# Patient Record
Sex: Female | Born: 1990 | Hispanic: Yes | Marital: Single | Smoking: Never smoker
Health system: Southern US, Community
[De-identification: ages and names within clinical notes are randomized; demographics above are authoritative.]

## PROBLEM LIST (undated history)

## (undated) DIAGNOSIS — J45909 Unspecified asthma, uncomplicated: Secondary | ICD-10-CM

## (undated) HISTORY — PX: OTHER SURGICAL HISTORY: SHX169

---

## 2014-10-17 ENCOUNTER — Encounter (HOSPITAL_COMMUNITY): Payer: Self-pay | Admitting: Emergency Medicine

## 2014-10-17 ENCOUNTER — Emergency Department (HOSPITAL_COMMUNITY)
Admission: EM | Admit: 2014-10-17 | Discharge: 2014-10-17 | Disposition: A | Discharge status: Home / Self Care | Payer: PRIVATE HEALTH INSURANCE | Attending: Emergency Medicine | Admitting: Emergency Medicine

## 2014-10-17 DIAGNOSIS — R1011 Right upper quadrant pain: Secondary | ICD-10-CM | POA: Diagnosis not present

## 2014-10-17 DIAGNOSIS — R110 Nausea: Secondary | ICD-10-CM | POA: Insufficient documentation

## 2014-10-17 HISTORY — DX: Unspecified asthma, uncomplicated: J45.909

## 2014-10-17 LAB — COMPREHENSIVE METABOLIC PANEL
ALT: 32 U/L (ref 0–53)
ANION GAP: 9 (ref 5–15)
AST: 26 U/L (ref 0–37)
Albumin: 4.6 g/dL (ref 3.5–5.2)
Alkaline Phosphatase: 61 U/L (ref 39–117)
BUN: 8 mg/dL (ref 6–23)
CALCIUM: 9.2 mg/dL (ref 8.4–10.5)
CO2: 22 mmol/L (ref 19–32)
Chloride: 108 mmol/L (ref 96–112)
Creatinine, Ser: 0.69 mg/dL (ref 0.50–1.35)
GFR calc Af Amer: >90 mL/min (ref >90)
GFR calc non Af Amer: >90 mL/min (ref >90)
GLUCOSE: 99 mg/dL (ref 70–99)
Potassium: 3.9 mmol/L (ref 3.5–5.1)
Sodium: 139 mmol/L (ref 135–145)
Total Bilirubin: 1.6 mg/dL — ABNORMAL HIGH (ref 0.3–1.2)
Total Protein: 7.8 g/dL (ref 6.0–8.3)

## 2014-10-17 LAB — CBC WITH DIFFERENTIAL/PLATELET
Basophils Absolute: 0 10*3/uL (ref 0.0–0.1)
Basophils Relative: 1 % (ref 0–1)
Eosinophils Absolute: 0.1 10*3/uL (ref 0.0–0.7)
Eosinophils Relative: 2 % (ref 0–5)
HEMATOCRIT: 47.1 % (ref 39.0–52.0)
Hemoglobin: 16.4 g/dL (ref 13.0–17.0)
LYMPHS ABS: 1.6 10*3/uL (ref 0.7–4.0)
LYMPHS PCT: 32 % (ref 12–46)
MCH: 30.9 pg (ref 26.0–34.0)
MCHC: 34.8 g/dL (ref 30.0–36.0)
MCV: 88.7 fL (ref 78.0–100.0)
Monocytes Absolute: 0.4 10*3/uL (ref 0.1–1.0)
Monocytes Relative: 8 % (ref 3–12)
NEUTROS ABS: 2.9 10*3/uL (ref 1.7–7.7)
Neutrophils Relative %: 57 % (ref 43–77)
Platelets: 234 10*3/uL (ref 150–400)
RBC: 5.31 MIL/uL (ref 4.22–5.81)
RDW: 12.5 % (ref 11.5–15.5)
WBC: 5 10*3/uL (ref 4.0–10.5)

## 2014-10-17 LAB — URINALYSIS, ROUTINE W REFLEX MICROSCOPIC
BILIRUBIN URINE: NEGATIVE
Glucose, UA: NEGATIVE mg/dL
Hgb urine dipstick: NEGATIVE
KETONES UR: NEGATIVE mg/dL
Leukocytes, UA: NEGATIVE
NITRITE: NEGATIVE
PH: 5.5 (ref 5.0–8.0)
PROTEIN: NEGATIVE mg/dL
Specific Gravity, Urine: 1.021 (ref 1.005–1.030)
UROBILINOGEN UA: 1 mg/dL (ref 0.0–1.0)

## 2014-10-17 LAB — LIPASE, BLOOD: Lipase: 20 U/L (ref 11–59)

## 2014-10-17 NOTE — Discharge Instructions (Signed)
Abdominal Pain Your lab work was normal with the exception of bilirubin being slightly high at 1.6. (1.2 is considered normal.) Call the [facility] to get a primary care physician and arrange to get your bilirubin rechecked within a month. Return if you feel worse for any reason Many things can cause belly (abdominal) pain. Most times, the belly pain is not dangerous. Many cases of belly pain can be watched and treated at home. HOME CARE   Do not take medicines that help you go poop (laxatives) unless told to by your doctor.  Only take medicine as told by your doctor.  Eat or drink as told by your doctor. Your doctor will tell you if you should be on a special diet. GET HELP IF:  You do not know what is causing your belly pain.  You have belly pain while you are sick to your stomach (nauseous) or have runny poop (diarrhea).  You have pain while you pee or poop.  Your belly pain wakes you up at night.  You have belly pain that gets worse or better when you eat.  You have belly pain that gets worse when you eat fatty foods.  You have a fever. GET HELP RIGHT AWAY IF:   The pain does not go away within 2 hours.  You keep throwing up (vomiting).  The pain changes and is only in the right or left part of the belly.  You have bloody or tarry looking poop. MAKE SURE YOU:   Understand these instructions.  Will watch your condition.  Will get help right away if you are not doing well or get worse. Document Released: 01/27/2008 Document Revised: 08/15/2013 Document Reviewed: 04/19/2013 [facility] Patient Information 2015 [place], [place]. This information is not intended to replace advice given to you by your health care provider. Make sure you discuss any questions you have with your health care provider.

## 2014-10-17 NOTE — ED Provider Notes (Signed)
CSN: [id number]     Arrival date & time 10/17/14  0847 History   First MD Initiated Contact with Patient 10/17/14 [phone]     No chief complaint on file.  Chief complaint abdominal pain  (Consider location/radiation/quality/duration/timing/severity/associated sxs/prior Treatment) HPI Complains of abdominal pain at right upper quadrant onset yesterday afternoon, nonradiating, waxes and wanes presently a 1 on a scale of 1-10. No treatment prior to coming here he admits to nausea yesterday, none today. Last bowel movement yesterday, normal. No flank pain. No fever. No urinary symptoms. Nothing makes symptoms better or worse. No other associated symptoms. Treated with Pepto-Bismol last night. No past medical history on file. past history negative  No past surgical history on file.  Past surgical history negative  No family history on file. History  Substance Use Topics  . Smoking status: Not on file  . Smokeless tobacco: Not on file  . Alcohol Use: Not on file   No tobacco no alcohol no drugs   Review of Systems  Constitutional: Negative.   HENT: Negative.   Respiratory: Negative.   Cardiovascular: Negative.   Gastrointestinal: Positive for nausea and abdominal pain.  Musculoskeletal: Negative.   Skin: Negative.   Neurological: Negative.   Psychiatric/Behavioral: Negative.   All other systems reviewed and are negative.     Allergies  Review of patient's allergies indicates not on file.  Home Medications   Prior to Admission medications   Not on File   BP 121/73 mmHg  Pulse 64  Temp(Src) 98.1 F (36.7 C) (Oral)  Resp 20  SpO2 99% Physical Exam  Constitutional: He appears well-developed and well-nourished.  HENT:  Head: Normocephalic and atraumatic.  Eyes: Conjunctivae are normal. Pupils are equal, round, and reactive to light.  Neck: Neck supple. No tracheal deviation present. No thyromegaly present.  Cardiovascular: Normal rate and regular rhythm.   No murmur  heard. Pulmonary/Chest: Effort normal and breath sounds normal.  Abdominal: Soft. Bowel sounds are normal. He exhibits no distension. There is no tenderness.  Genitourinary:  testes in norma lie, scrotum normal. No flank tenderness  Musculoskeletal: Normal range of motion. He exhibits no edema or tenderness.  Neurological: He is alert. Coordination normal.  Skin: Skin is warm and dry. No rash noted.  Psychiatric: He has a normal mood and affect.  Nursing note and vitals reviewed.   ED Course  Procedures (including critical care time) Labs Review Labs Reviewed - No data to display  Imaging Review No results found.   EKG Interpretation None     12:20 PM Patient resting comfortably, asymptomatic Results for orders placed or performed during the hospital encounter of 10/17/14  Comprehensive metabolic panel  Result Value Ref Range   Sodium 139 135 - 145 mmol/L   Potassium 3.9 3.5 - 5.1 mmol/L   Chloride 108 96 - 112 mmol/L   CO2 22 19 - 32 mmol/L   Glucose, Bld 99 70 - 99 mg/dL   BUN 8 6 - 23 mg/dL   Creatinine, Ser 0.69 0.50 - 1.35 mg/dL   Calcium 9.2 8.4 - 10.5 mg/dL   Total Protein 7.8 6.0 - 8.3 g/dL   Albumin 4.6 3.5 - 5.2 g/dL   AST 26 0 - 37 U/L   ALT 32 0 - 53 U/L   Alkaline Phosphatase 61 39 - 117 U/L   Total Bilirubin 1.6 (H) 0.3 - 1.2 mg/dL   GFR calc non Af Amer >90 >90 mL/min   GFR calc Af Amer >90 >90 mL/min  Anion gap 9 5 - 15  CBC with Differential/Platelet  Result Value Ref Range   WBC 5.0 4.0 - 10.5 K/uL   RBC 5.31 4.22 - 5.81 MIL/uL   Hemoglobin 16.4 13.0 - 17.0 g/dL   HCT 47.1 39.0 - 52.0 %   MCV 88.7 78.0 - 100.0 fL   MCH 30.9 26.0 - 34.0 pg   MCHC 34.8 30.0 - 36.0 g/dL   RDW 12.5 11.5 - 15.5 %   Platelets 234 150 - 400 K/uL   Neutrophils Relative % 57 43 - 77 %   Neutro Abs 2.9 1.7 - 7.7 K/uL   Lymphocytes Relative 32 12 - 46 %   Lymphs Abs 1.6 0.7 - 4.0 K/uL   Monocytes Relative 8 3 - 12 %   Monocytes Absolute 0.4 0.1 - 1.0 K/uL    Eosinophils Relative 2 0 - 5 %   Eosinophils Absolute 0.1 0.0 - 0.7 K/uL   Basophils Relative 1 0 - 1 %   Basophils Absolute 0.0 0.0 - 0.1 K/uL  Lipase, blood  Result Value Ref Range   Lipase 20 11 - 59 U/L  Urinalysis, Routine w reflex microscopic  Result Value Ref Range   Color, Urine YELLOW YELLOW   APPearance CLEAR CLEAR   Specific Gravity, Urine 1.021 1.005 - 1.030   pH 5.5 5.0 - 8.0   Glucose, UA NEGATIVE NEGATIVE mg/dL   Hgb urine dipstick NEGATIVE NEGATIVE   Bilirubin Urine NEGATIVE NEGATIVE   Ketones, ur NEGATIVE NEGATIVE mg/dL   Protein, ur NEGATIVE NEGATIVE mg/dL   Urobilinogen, UA 1.0 0.0 - 1.0 mg/dL   Nitrite NEGATIVE NEGATIVE   Leukocytes, UA NEGATIVE NEGATIVE   No results found.  MDM  Lab work remarkable for mild hyperbilirubinemia which I don't feel is of clinical consequence no elevated LFTs, normal lipase. Pain is felt to be nonspecific Final diagnoses:  None   plan referral [facility] Diagnosis #1 abdominal pain #2 hyperbilirubinemia      Orlie Dakin, MD 10/17/14 1225

## 2014-10-17 NOTE — ED Notes (Signed)
Pt c/o RLQ pain that started 3 days ago but got worse yesterday.  Pt had some nausea yesterday but denies any today as well as no v/d.  Pt denies any dysuria or hematuria.

## 2014-10-17 NOTE — ED Notes (Signed)
MD at bedside. 

## 2017-10-25 ENCOUNTER — Encounter: Payer: Self-pay | Admitting: Family Medicine

## 2017-11-04 ENCOUNTER — Telehealth: Payer: Self-pay | Admitting: Family Medicine

## 2017-11-04 ENCOUNTER — Other Ambulatory Visit: Payer: Self-pay

## 2017-11-04 ENCOUNTER — Encounter: Payer: Self-pay | Admitting: Family Medicine

## 2017-11-04 ENCOUNTER — Ambulatory Visit (INDEPENDENT_AMBULATORY_CARE_PROVIDER_SITE_OTHER): Payer: Managed Care, Other (non HMO) | Admitting: Family Medicine

## 2017-11-04 VITALS — BP 90/60 | HR 80 | Temp 97.0°F | Resp 16 | Ht 66.54 in | Wt 192.8 lb

## 2017-11-04 DIAGNOSIS — Z789 Other specified health status: Secondary | ICD-10-CM

## 2017-11-04 DIAGNOSIS — F640 Transsexualism: Secondary | ICD-10-CM

## 2017-11-04 DIAGNOSIS — R635 Abnormal weight gain: Secondary | ICD-10-CM | POA: Diagnosis not present

## 2017-11-04 DIAGNOSIS — E049 Nontoxic goiter, unspecified: Secondary | ICD-10-CM

## 2017-11-04 DIAGNOSIS — E349 Endocrine disorder, unspecified: Secondary | ICD-10-CM

## 2017-11-04 MED ORDER — ESTRADIOL 2 MG PO TABS: 6.0000 mg | ORAL_TABLET | Freq: Every day | ORAL | 0 refills | Status: DC

## 2017-11-04 MED ORDER — SPIRONOLACTONE 100 MG PO TABS: 100.0000 mg | ORAL_TABLET | Freq: Two times a day (BID) | ORAL | 0 refills | Status: DC

## 2017-11-04 MED ORDER — DUTASTERIDE 0.5 MG PO CAPS: 0.5000 mg | ORAL_CAPSULE | Freq: Every day | ORAL | 0 refills | Status: DC

## 2017-11-04 NOTE — Patient Instructions (Signed)
     IF you received an x-ray today, you will receive an invoice from [facility]. Please contact [facility] at [phone] with questions or concerns regarding your invoice.   IF you received labwork today, you will receive an invoice from [facility]. Please contact [facility] at [phone] with questions or concerns regarding your invoice.   Our billing staff will not be able to assist you with questions regarding bills from these companies.  You will be contacted with the lab results as soon as they are available. The fastest way to get your results is to activate your My Chart account. Instructions are located on the last page of this paperwork. If you have not heard from us regarding the results in 2 weeks, please contact this office.     

## 2017-11-04 NOTE — Telephone Encounter (Signed)
Copied from [place] [phone]. Topic: Quick Communication - Rx Refill/Question >> Nov 04, 2017  6:36 PM Cecelia Byars, NT wrote: Medication:  medroxyPROGESTERone (PROVERA) 5 MG tablet  Has the patient contacted their pharmacy? {yes  (Agent: If no, request that the patient contact the pharmacy for the refill. Preferred Pharmacy (with phone number or street name [facility] [place], [place] - [place] [phone] (Phone) [phone] (Fax   Agent: Please be advised that RX refills may take up to 3 business days. We ask that you follow-up with your pharmacy.

## 2017-11-04 NOTE — Progress Notes (Signed)
Subjective:  By signing my name below, I, [name], attest that this documentation has been prepared under the direction and in the presence of Delman Cheadle, MD Electronically Signed: Ladene Artist, ED Scribe 11/04/2017 at 5:31 PM.   Patient ID: Elizabeth Lane, female    DOB: [birth year], 27 y.o.   MRN: [id number]  Chief Complaint  Patient presents with  . Establish Care   HPI Elizabeth Lane is a 27 y.o. female who presents to Primary Care at [facility] to establish care. Pt has been on hormone therapy for 2.5 yrs. Denies lightheadedness, dizziness, any other side-effects other than feet swelling when she ran out of the medication but states symptoms resolved after restarting. Estradiol was increased from 2 to 3 tabs daily ~1 yr ago, added provera this yr and pt is doing spironolactone once a day. Pt is interested in switching to injections as she is hoping for more changes body wise; increasing breast size and lowering libido. Nonsmoker. Pt has been walking for exercise and has a pretty decent diet overall.  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  Family Hx Mother has a h/o ovarian CA. No family hx breast CA, early heart disease or stroke.  No past medical history on file.  Current Outpatient Medications on File Prior to Visit  Medication Sig Dispense Refill  . estradiol (ESTRACE) 2 MG tablet     . medroxyPROGESTERone (PROVERA) 5 MG tablet     . spironolactone (ALDACTONE) 100 MG tablet      No current facility-administered medications on file prior to visit.    Allergies  Allergen Reactions  . Lactose Intolerance (Gi)     GI issues   History reviewed. No pertinent surgical history. Family History  Problem Relation Age of Onset  . Cancer Mother    Social History   Socioeconomic History  . Marital status: Single    Spouse name: None  . Number of children: None  . Years of education: None  . Highest education level: None  Social Needs  . Financial  resource strain: None  . Food insecurity - worry: None  . Food insecurity - inability: None  . Transportation needs - medical: None  . Transportation needs - non-medical: None  Occupational History  . None  Tobacco Use  . Smoking status: Never Smoker  . Smokeless tobacco: Never Used  Substance and Sexual Activity  . Alcohol use: Yes    Frequency: Never    Comment: 1 a month/ socailly  . Drug use: No  . Sexual activity: Yes  Other Topics Concern  . None  Social History Narrative  . None   Depression screen [facility] [place] 2/9 11/04/2017  Decreased Interest 0  Down, Depressed, Hopeless 0  PHQ - 2 Score 0     Review of Systems  Neurological: Negative for dizziness and light-headedness.      Objective:   Physical Exam  Constitutional: She is oriented to person, place, and time. She appears well-developed and well-nourished. No distress.  HENT:  Head: Normocephalic and atraumatic.  Eyes: Conjunctivae and EOM are normal.  Neck: Neck supple. No tracheal deviation present.  Cardiovascular: Normal rate, regular rhythm and normal heart sounds.  Pulmonary/Chest: Effort normal and breath sounds normal. No respiratory distress.  Musculoskeletal: Normal range of motion.  Neurological: She is alert and oriented to person, place, and time.  Skin: Skin is warm and dry.  Psychiatric: She has a normal mood and affect. Her behavior is  normal.  Nursing note and vitals reviewed.  BP 90/60 (BP Location: Left Arm, Patient Position: Sitting, Cuff Size: Normal)   Pulse 80   Temp (!) 97 F (36.1 C) (Oral)   Resp 16   Ht 5' 6.54" (1.69 m)   Wt 192 lb 12.8 oz (87.5 kg)   SpO2 97%   BMI 30.62 kg/m     Assessment & Plan:   1. Endocrine disorder - hoping for more feminizing effects so will increase estradiol from 6mg  qd to 8mg  qd as long as estradiol blood level today is not towards high end of normal range.  Try avodart as will block the masculinizing effects of the testosterone though may not  actually lower the blood levels - more feminizing effects than finasteride.  If pt does not feel better on increased dose of estradiol and with avodart, ok to call and will change to weekly injectable estrogen - likely need to start off on 40mg  qwk IM.  2. Weight gain, abnormal - has been working on diet and exercise w/o luck so check tsh, a1c.  Could be progesterone so consider trial off in future - ok to stop in several weeks if she would like to do sev mos trial off to see if that helps w/ weight loss  3. Enlarged thyroid gland - recheck at f/u and consider [place]. Check labs.    Orders Placed This Encounter  Procedures  . TestT+TestF+SHBG  . TSH  . CBC with Differential/Platelet  . Comprehensive metabolic panel  . Estradiol  . FSH/LH  . Hemoglobin A1c    Meds ordered this encounter  Medications  . estradiol (ESTRACE) 2 MG tablet    Sig: Take 3 tablets (6 mg total) by mouth daily.    Dispense:  270 tablet    Refill:  0  . spironolactone (ALDACTONE) 100 MG tablet    Sig: Take 1 tablet (100 mg total) by mouth 2 (two) times daily.    Dispense:  180 tablet    Refill:  0  . dutasteride (AVODART) 0.5 MG capsule    Sig: Take 1 capsule (0.5 mg total) by mouth daily.    Dispense:  90 capsule    Refill:  0  . medroxyPROGESTERone (PROVERA) 5 MG tablet    Sig: Take 1 tablet (5 mg total) by mouth daily.    Dispense:  90 tablet    Refill:  1    I personally performed the services described in this documentation, which was scribed in my presence. The recorded information has been reviewed and considered, and addended by me as needed.   Delman Cheadle, M.D.  Primary Care at [facility] [address] [place], [place] [address] [phone] phone [phone] fax  11/05/17 11:21 AM

## 2017-11-05 ENCOUNTER — Encounter: Payer: Self-pay | Admitting: Family Medicine

## 2017-11-05 DIAGNOSIS — Z789 Other specified health status: Secondary | ICD-10-CM | POA: Insufficient documentation

## 2017-11-05 DIAGNOSIS — F640 Transsexualism: Secondary | ICD-10-CM | POA: Insufficient documentation

## 2017-11-05 MED ORDER — MEDROXYPROGESTERONE ACETATE 5 MG PO TABS: 5.0000 mg | ORAL_TABLET | Freq: Every day | ORAL | 1 refills | Status: DC

## 2017-11-05 MED ORDER — ESTRADIOL 2 MG PO TABS: 4.0000 mg | ORAL_TABLET | Freq: Two times a day (BID) | ORAL | 0 refills | Status: DC

## 2017-11-05 NOTE — Telephone Encounter (Signed)
Sent now

## 2017-11-05 NOTE — Telephone Encounter (Signed)
Pt called for an update, I called over to [place] and spoke to Vernon (CMA). Who stated she will look into it and give the pt a call.

## 2017-11-05 NOTE — Telephone Encounter (Signed)
Please advise the pt stated it was supposed to be sent in but wasn't

## 2017-11-07 LAB — FSH/LH
FSH: 0.3 m[IU]/mL
LH: <0.2 m[IU]/mL

## 2017-11-07 LAB — TSH: TSH: 1.11 u[IU]/mL (ref 0.450–4.500)

## 2017-11-07 LAB — COMPREHENSIVE METABOLIC PANEL
ALT: 20 IU/L (ref 0–32)
AST: 13 IU/L (ref 0–40)
Albumin/Globulin Ratio: 1.4 (ref 1.2–2.2)
Albumin: 4.4 g/dL (ref 3.5–5.5)
Alkaline Phosphatase: 41 IU/L (ref 39–117)
BUN/Creatinine Ratio: 16 (ref 9–23)
BUN: 11 mg/dL (ref 6–20)
Bilirubin Total: 0.4 mg/dL (ref 0.0–1.2)
CALCIUM: 9.3 mg/dL (ref 8.7–10.2)
CO2: 21 mmol/L (ref 20–29)
CREATININE: 0.7 mg/dL (ref 0.57–1.00)
Chloride: 104 mmol/L (ref 96–106)
GFR, EST AFRICAN AMERICAN: 138 mL/min/{1.73_m2} (ref >59)
GFR, EST NON AFRICAN AMERICAN: 120 mL/min/{1.73_m2} (ref >59)
GLUCOSE: 99 mg/dL (ref 65–99)
Globulin, Total: 3.1 g/dL (ref 1.5–4.5)
Potassium: 4.2 mmol/L (ref 3.5–5.2)
Sodium: 140 mmol/L (ref 134–144)
TOTAL PROTEIN: 7.5 g/dL (ref 6.0–8.5)

## 2017-11-07 LAB — CBC WITH DIFFERENTIAL/PLATELET
BASOS ABS: 0.1 10*3/uL (ref 0.0–0.2)
BASOS: 1 %
EOS (ABSOLUTE): 0.1 10*3/uL (ref 0.0–0.4)
Eos: 1 %
Hematocrit: 42.2 % (ref 34.0–46.6)
Hemoglobin: 14.4 g/dL (ref 11.1–15.9)
IMMATURE GRANS (ABS): 0 10*3/uL (ref 0.0–0.1)
IMMATURE GRANULOCYTES: 0 %
LYMPHS: 24 %
Lymphocytes Absolute: 2.2 10*3/uL (ref 0.7–3.1)
MCH: 31 pg (ref 26.6–33.0)
MCHC: 34.1 g/dL (ref 31.5–35.7)
MCV: 91 fL (ref 79–97)
Monocytes Absolute: 0.9 10*3/uL (ref 0.1–0.9)
Monocytes: 9 %
NEUTROS PCT: 65 %
Neutrophils Absolute: 5.9 10*3/uL (ref 1.4–7.0)
PLATELETS: 324 10*3/uL (ref 150–379)
RBC: 4.64 x10E6/uL (ref 3.77–5.28)
RDW: 12.8 % (ref 12.3–15.4)
WBC: 9.1 10*3/uL (ref 3.4–10.8)

## 2017-11-07 LAB — TESTT+TESTF+SHBG
SEX HORMONE BINDING: 124.3 nmol/L — AB (ref 24.6–122.0)
Testosterone, Free: 1.2 pg/mL (ref 0.0–4.2)
Testosterone, total: 9.2 ng/dL — ABNORMAL LOW (ref 10.0–55.0)

## 2017-11-07 LAB — ESTRADIOL: Estradiol: 147.4 pg/mL

## 2017-11-07 LAB — HEMOGLOBIN A1C
Est. average glucose Bld gHb Est-mCnc: 108 mg/dL
HEMOGLOBIN A1C: 5.4 % (ref 4.8–5.6)

## 2017-11-09 ENCOUNTER — Telehealth: Payer: Self-pay | Admitting: Family Medicine

## 2017-11-09 NOTE — Telephone Encounter (Signed)
See note in chart

## 2017-11-09 NOTE — Telephone Encounter (Signed)
Copied from [place]. Topic: Quick Communication - Lab Results >> Nov 09, 2017 11:06 AM Suszanne Finch, LPN wrote: Hulen Skains patient to inform them of  lab results. When patient returns call, triage nurse may disclose results.  Phone is [phone]

## 2017-11-09 NOTE — Telephone Encounter (Signed)
Call to patient- lab results ( medication instructions) read as in chart- instructed patient to call back with questions.   Lab not in basket.

## 2017-11-09 NOTE — Telephone Encounter (Signed)
Pt calling to get lab results and states that a detailed voicemail may be left with results.

## 2017-11-11 ENCOUNTER — Telehealth: Payer: Self-pay | Admitting: Family Medicine

## 2017-11-11 ENCOUNTER — Telehealth: Payer: Self-pay | Admitting: *Deleted

## 2017-11-11 NOTE — Telephone Encounter (Signed)
Please advise 

## 2017-11-11 NOTE — Telephone Encounter (Signed)
PA INITIATED 11/11/2017

## 2017-11-11 NOTE — Telephone Encounter (Signed)
Copied from [place]. Topic: Quick Communication - See Telephone Encounter >> Nov 11, 2017  3:53 PM Ivar Drape wrote: CRM for notification. See Telephone encounter for: 11/11/17. Patient would like to know the status of the prescription request for dutasteride (AVODART) 0.5 MG capsule

## 2017-11-11 NOTE — Telephone Encounter (Signed)
Testosterone levels not addressed- not comments on those levels.

## 2017-11-12 ENCOUNTER — Telehealth: Payer: Self-pay

## 2017-11-12 NOTE — Telephone Encounter (Signed)
This is a duplicated message. See telephone call form 3/21

## 2017-11-12 NOTE — Telephone Encounter (Signed)
Notice of Adverse Benefit Determination sent from [facility] regarding Dutasteride. Letter placed in box.

## 2017-11-12 NOTE — Telephone Encounter (Signed)
PA for patient was initiated on Avodart: Key: [facility] - PA Case ID: [id number] - Rx #: [id number]   PA was denied. Phone call to [employer], spoke with Estill Bamberg. Reason for denial is "Coverage is provided in situations where patient has tried brand or generic finasteride."  Provider, please advise.

## 2017-11-14 MED ORDER — FINASTERIDE 5 MG PO TABS: 5.0000 mg | ORAL_TABLET | Freq: Every day | ORAL | 0 refills | Status: DC

## 2017-11-14 NOTE — Telephone Encounter (Signed)
Sent to lab pool to mail letter w/ full comments.

## 2017-11-14 NOTE — Telephone Encounter (Signed)
Please inform pt that her insurance company is dictating therapy options - they want to her to try finasteride before they will consider paying for dutasteride instead so finasteride sent to pharmacy for her to try and we can discuss further at next visit.

## 2017-11-15 ENCOUNTER — Encounter: Payer: Self-pay | Admitting: *Deleted

## 2017-11-15 NOTE — Telephone Encounter (Signed)
Patient informed. Voiced understanding.

## 2017-11-16 ENCOUNTER — Telehealth: Payer: Self-pay | Admitting: Family Medicine

## 2017-11-16 NOTE — Telephone Encounter (Signed)
Copied from [place] [id number]. Topic: Quick Communication - See Telephone Encounter >> Nov 16, 2017  4:03 PM Cleaster Corin, NT wrote: CRM for notification. See Telephone encounter for: 11/16/17. Pt. Wants to ask Dr. Brigitte Pulse which med. Does she want her to take finasteride (PROSCAR) 5 MG tablet [[id number]] or estradiol (ESTRACE) 2 MG tablet [[id number]] she has picked up both from pharmacy. Pt. Would like for Dr. Jaymes Graff nurse to give her a call back

## 2017-11-17 NOTE — Telephone Encounter (Signed)
BOTH. DEF still (and will always) need to be on estrogen - so never stop the estradiol (unless advised by MD due to extreme medical complications of course). However, the finasteride will help decrease the effect of what little testosterone hormone she does still have - so think of that as a booster for the spironolactone (which she should still continue as well).  Finasteride was rx'd in the 3/21 telephone note when the PA for avodart/dutasteride was denied.

## 2017-11-17 NOTE — Telephone Encounter (Signed)
Phone call to patient. Per signed authorization, left message from Dr. Brigitte Pulse below. Please call back if any questions or concerns.

## 2017-11-17 NOTE — Telephone Encounter (Signed)
Proscar is not listed as a prescribed medication. In note I see you wanted pt to start avodart but I do not see this as an ordered medication. Please advise on plan of treatment for pt.

## 2017-11-18 ENCOUNTER — Ambulatory Visit (INDEPENDENT_AMBULATORY_CARE_PROVIDER_SITE_OTHER): Payer: Managed Care, Other (non HMO) | Admitting: Family Medicine

## 2017-11-18 ENCOUNTER — Encounter: Payer: Self-pay | Admitting: Family Medicine

## 2017-11-18 ENCOUNTER — Ambulatory Visit (INDEPENDENT_AMBULATORY_CARE_PROVIDER_SITE_OTHER): Payer: Managed Care, Other (non HMO)

## 2017-11-18 VITALS — BP 115/74 | HR 73 | Temp 98.1°F | Resp 16 | Ht 66.0 in | Wt 194.2 lb

## 2017-11-18 DIAGNOSIS — M79671 Pain in right foot: Secondary | ICD-10-CM

## 2017-11-18 DIAGNOSIS — S93621A Sprain of tarsometatarsal ligament of right foot, initial encounter: Secondary | ICD-10-CM

## 2017-11-18 MED ORDER — DICLOFENAC SODIUM 75 MG PO TBEC: 75.0000 mg | DELAYED_RELEASE_TABLET | Freq: Three times a day (TID) | ORAL | 0 refills | Status: DC | PRN

## 2017-11-18 NOTE — Patient Instructions (Addendum)
Ice 15-20 minutes 3-4x/d. Wear a shoe with a thick firm sole and wide toe box (clog, hiking boot, or medical post-op/cast shoe. Keep off of it and keep it elevated as much as you can. Start the diclofenac.   Start the stretching exercises and become more aggressive with them as the pain recedes while walking.  IF you received an x-ray today, you will receive an invoice from [facility]. Please contact [facility] at [phone] with questions or concerns regarding your invoice.   IF you received labwork today, you will receive an invoice from [facility]. Please contact [facility] at [phone] with questions or concerns regarding your invoice.   Our billing staff will not be able to assist you with questions regarding bills from these companies.  You will be contacted with the lab results as soon as they are available. The fastest way to get your results is to activate your My Chart account. Instructions are located on the last page of this paperwork. If you have not heard from [place] regarding the results in 2 weeks, please contact this office.     Foot Sprain A foot sprain is an injury to one of the strong bands of tissue (ligaments) that connect and support the many bones in your feet. The ligament can be stretched too much or it can tear. A tear can be either partial or complete. The severity of the sprain depends on how much of the ligament was damaged or torn. What are the causes? A foot sprain is usually caused by suddenly twisting or pivoting your foot. What increases the risk? This injury is more likely to occur in people who:  Play a sport, such as basketball or football.  Exercise or play a sport without warming up.  Start a new workout or sport.  Suddenly increase how long or hard they exercise or play a sport.  What are the signs or symptoms? Symptoms of this condition start soon after an injury and include:  Pain, especially in the arch of the  foot.  Bruising.  Swelling.  Inability to walk or use the foot to support body weight.  How is this diagnosed? This condition is diagnosed with a medical history and physical exam. You may also have imaging tests, such as:  X-rays to make sure there are no broken bones (fractures).  MRI to see if the ligament has torn.  How is this treated? Treatment varies depending on the severity of your sprain. Mild sprains can be treated with rest, ice, compression, and elevation (RICE). If your ligament is overstretched or partially torn, treatment usually involves keeping your foot in a fixed position (immobilization) for a period of time. To help you do this, your health care provider will apply a bandage, splint, or walking boot to keep your foot from moving until it heals. You may also be advised to use crutches or a scooter for a few weeks to avoid bearing weight on your foot while it is healing. If your ligament is fully torn, you may need surgery to reconnect the ligament to the bone. After surgery, a cast or splint will be applied and will need to stay on your foot while it heals. Your health care provider may also suggest exercises or physical therapy to strengthen your foot. Follow these instructions at home: If You Have a Bandage, Splint, or Walking Boot:  Wear it as directed by your health care provider. Remove it only as directed by your health care provider.  Loosen the  bandage, splint, or walking boot if your toes become numb and tingle, or if they turn cold and blue. Bathing  If your health care provider approves bathing and showering, cover the bandage or splint with a watertight plastic bag to protect it from water. Do not let the bandage or splint get wet. Managing pain, stiffness, and swelling  If directed, apply ice to the injured area: ? Put ice in a plastic bag. ? Place a towel between your skin and the bag. ? Leave the ice on for 20 minutes, 2-3 times per day.  Move  your toes often to avoid stiffness and to lessen swelling.  Raise (elevate) the injured area above the level of your heart while you are sitting or lying down. Driving  Do not drive or operate heavy machinery while taking pain medicine.  Ask your health care provider when it is safe to drive if you have a bandage, splint, or walking boot on your foot. Activity  Rest as directed by your health care provider.  Do not use the injured foot to support your body weight until your health care provider says that you can. Use crutches or other supportive devices as directed by your health care provider.  Ask your health care provider what activities are safe for you. Gradually increase how much and how far you walk until your health care provider says it is safe to return to full activity.  Do any exercise or physical therapy as directed by your health care provider. General instructions  If a splint was applied, do not put pressure on any part of it until it is fully hardened. This may take several hours.  Take medicines only as directed by your health care provider. These include over-the-counter medicines and prescription medicines.  Keep all follow-up visits as directed by your health care provider. This is important.  When you can walk without pain, wear supportive shoes that have stiff soles. Do not wear flip-flops, and do not walk barefoot. Contact a health care provider if:  Your pain is not controlled with medicine.  Your bruising or swelling gets worse or does not get better with treatment.  Your splint or walking boot is damaged. Get help right away if:  You develop severe numbness or tingling in your foot.  Your foot turns blue, white, or gray, and it feels cold. This information is not intended to replace advice given to you by your health care provider. Make sure you discuss any questions you have with your health care provider. Document Released: 01/30/2002 Document  Revised: 01/16/2016 Document Reviewed: 06/13/2014 Elsevier Interactive Patient Education  2018 [employer].

## 2017-11-18 NOTE — Progress Notes (Signed)
Subjective:  By signing my name below, I, Elizabeth Lane, attest that this documentation has been prepared under the direction and in the presence of Elizabeth Cheadle, MD Electronically Signed: Ladene Artist, ED Scribe 11/18/2017 at 1:34 PM.   Patient ID: Elizabeth Lane, adult    DOB: August 23, 1991, 27 y.o.   MRN: [id number]  Chief Complaint  Patient presents with  . Foot Pain    right foot pain x 1 week; hurts to walk   HPI Elizabeth Lane is a 27 y.o. adult who presents to Primary Care at [facility] complaining of sudden onset of R foot pain onset 5 days ago. Pt states that she was walking around downtown for 3 hrs but noticed R foot pain around the second hour of walking. She was wearing walking shoes when the pain occurred. No known injury. She reports increased pain with bearing weight, none at rest. Denies ankle pain, previous foot pain/injury.  No past medical history on file. Current Outpatient Medications on File Prior to Visit  Medication Sig Dispense Refill  . estradiol (ESTRACE) 2 MG tablet Take 2 tablets (4 mg total) by mouth 2 (two) times daily. 360 tablet 0  . finasteride (PROSCAR) 5 MG tablet Take 1 tablet (5 mg total) by mouth daily. 90 tablet 0  . medroxyPROGESTERone (PROVERA) 5 MG tablet Take 1 tablet (5 mg total) by mouth daily. 90 tablet 1  . spironolactone (ALDACTONE) 100 MG tablet Take 1 tablet (100 mg total) by mouth 2 (two) times daily. 180 tablet 0   No current facility-administered medications on file prior to visit.     History reviewed. No pertinent surgical history.  Allergies  Allergen Reactions  . Lactose Intolerance (Gi)     GI issues   Family History  Problem Relation Age of Onset  . Cancer Mother    Social History   Socioeconomic History  . Marital status: Single    Spouse name: Not on file  . Number of children: Not on file  . Years of education: Not on file  . Highest education level: Not on file  Occupational History  . Not on file  Social Needs    . Financial resource strain: Not on file  . Food insecurity:    Worry: Not on file    Inability: Not on file  . Transportation needs:    Medical: Not on file    Non-medical: Not on file  Tobacco Use  . Smoking status: Never Smoker  . Smokeless tobacco: Never Used  Substance and Sexual Activity  . Alcohol use: Yes    Frequency: Never    Comment: 1 a month/ socailly  . Drug use: No  . Sexual activity: Yes  Lifestyle  . Physical activity:    Days per week: Not on file    Minutes per session: Not on file  . Stress: Not on file  Relationships  . Social connections:    Talks on phone: Not on file    Gets together: Not on file    Attends religious service: Not on file    Active member of club or organization: Not on file    Attends meetings of clubs or organizations: Not on file    Relationship status: Not on file  Other Topics Concern  . Not on file  Social History Narrative  . Not on file   Depression screen [facility] 2/9 11/04/2017  Decreased Interest 0  Down, Depressed, Hopeless 0  PHQ - 2 Score 0  Review of Systems  Musculoskeletal: Positive for myalgias. Negative for arthralgias.      Objective:   Physical Exam  Constitutional: She is oriented to person, place, and time. She appears well-developed and well-nourished. No distress.  HENT:  Head: Normocephalic and atraumatic.  Eyes: Conjunctivae and EOM are normal.  Neck: Neck supple. No tracheal deviation present.  Cardiovascular: Normal rate.  Pulmonary/Chest: Effort normal. No respiratory distress.  Musculoskeletal: Normal range of motion.  Tenderness at the proximal fifth metatarsal head and TMT. Strength 5/5. No pain with inversion, eversion or squeeze.  Neurological: She is alert and oriented to person, place, and time.  Skin: Skin is warm and dry.  Psychiatric: She has a normal mood and affect. Her behavior is normal.  Nursing note and vitals reviewed.  BP 115/74   Pulse 73   Temp 98.1 F (36.7 C)  (Oral)   Resp 16   Ht 5\' 6"  (1.676 m)   Wt 194 lb 3.2 oz (88.1 kg)   SpO2 97%   BMI 31.34 kg/m     Dg Foot Complete Right  Result Date: 11/18/2017 CLINICAL DATA:  Acute onset of pain 5 days ago centered over the proximal aspect of the fifth metatarsal. EXAM: RIGHT FOOT COMPLETE - 3+ VIEW COMPARISON:  None in PACs FINDINGS: The bones are subjectively adequately mineralized. The joint spaces are well maintained. There is no acute or healing fracture. Specific attention to the fifth metatarsal reveals no acute bony abnormality. The tarsometatarsal and metatarsophalangeal joints high data mid of the fifth ray appear normal. The soft tissues are unremarkable. IMPRESSION: There is no acute or significant chronic bony abnormality of the right foot. Specific attention to the fifth ray reveals no acute abnormality. Electronically Signed   By: Elizabeth  Lane M.D.   On: 11/18/2017 14:36   Assessment & Plan:   1. Acute foot pain, right   2. Sprain of tarsometatarsal joint of right foot, initial encounter    See AVS - RICE, nsaids, stretching, firm-sole shoe with wide toe box.  Orders Placed This Encounter  Procedures  . DG Foot Complete Right    Standing Status:   Future    Number of Occurrences:   1    Standing Expiration Date:   01/19/2019    Order Specific Question:   Reason for Exam (SYMPTOM  OR DIAGNOSIS REQUIRED)    Answer:   acute onset pain 5d prior at proximal head of 5th metatarsal    Order Specific Question:   Is patient pregnant?    Answer:   No    Order Specific Question:   Preferred imaging location?    Answer:   External    Order Specific Question:   Radiology Contrast Protocol - do NOT remove file path    Answer:   [email or web address]    Meds ordered this encounter  Medications  . diclofenac (VOLTAREN) 75 MG EC tablet    Sig: Take 1 tablet (75 mg total) by mouth 3 (three) times daily as needed for mild pain.    Dispense:  60 tablet     Refill:  0    I personally performed the services described in this documentation, which was scribed in my presence. The recorded information has been reviewed and considered, and addended by me as needed.   Elizabeth Lane, M.D.  Primary Care at [facility] [address] [place], [place] [address] [phone] phone [phone] fax  01/12/18 10:04 PM

## 2018-01-11 ENCOUNTER — Other Ambulatory Visit: Payer: Self-pay | Admitting: Family Medicine

## 2018-01-11 DIAGNOSIS — Z5181 Encounter for therapeutic drug level monitoring: Secondary | ICD-10-CM

## 2018-01-11 DIAGNOSIS — Z79899 Other long term (current) drug therapy: Secondary | ICD-10-CM

## 2018-01-11 DIAGNOSIS — Z7952 Long term (current) use of systemic steroids: Secondary | ICD-10-CM

## 2018-01-12 ENCOUNTER — Telehealth: Payer: Self-pay | Admitting: Family Medicine

## 2018-01-12 NOTE — Telephone Encounter (Signed)
estradiol refill Last OV: 11/04/17 Last Refill:11/05/17 #360 tabs Pharmacy:[facility] [address] PCP: Delman Cheadle MD

## 2018-01-12 NOTE — Telephone Encounter (Signed)
Please call to let pt know that if she wants to stop by the office several days or up to a week prior to her next OV to get her labs drawn in a lab only visit, that would be great.  This allow [place] to have the results available to review together and discuss the effect of the med changes and where she wants to go from there at the time of her visit next mo.

## 2018-01-12 NOTE — Addendum Note (Signed)
Addended by: Shawnee Knapp on: 01/12/2018 10:09 PM   Modules accepted: Orders

## 2018-01-14 NOTE — Telephone Encounter (Signed)
Pt advised.

## 2018-02-03 ENCOUNTER — Ambulatory Visit (INDEPENDENT_AMBULATORY_CARE_PROVIDER_SITE_OTHER): Payer: Managed Care, Other (non HMO) | Admitting: Family Medicine

## 2018-02-03 DIAGNOSIS — Z7952 Long term (current) use of systemic steroids: Secondary | ICD-10-CM

## 2018-02-03 DIAGNOSIS — Z5181 Encounter for therapeutic drug level monitoring: Secondary | ICD-10-CM

## 2018-02-03 DIAGNOSIS — Z79899 Other long term (current) drug therapy: Secondary | ICD-10-CM

## 2018-02-03 NOTE — Progress Notes (Signed)
Pt came for a lab only visit. Pt was not seen by provider.  

## 2018-02-05 LAB — COMPREHENSIVE METABOLIC PANEL
A/G RATIO: 1.4 (ref 1.2–2.2)
ALBUMIN: 4.2 g/dL (ref 3.5–5.5)
ALT: 19 IU/L (ref 0–32)
AST: 16 IU/L (ref 0–40)
Alkaline Phosphatase: 43 IU/L (ref 39–117)
BILIRUBIN TOTAL: 0.6 mg/dL (ref 0.0–1.2)
BUN / CREAT RATIO: 13 (ref 9–23)
BUN: 10 mg/dL (ref 6–20)
CHLORIDE: 103 mmol/L (ref 96–106)
CO2: 19 mmol/L — ABNORMAL LOW (ref 20–29)
Calcium: 9.6 mg/dL (ref 8.7–10.2)
Creatinine, Ser: 0.75 mg/dL (ref 0.57–1.00)
GFR calc non Af Amer: 110 mL/min/{1.73_m2} (ref >59)
GFR, EST AFRICAN AMERICAN: 127 mL/min/{1.73_m2} (ref >59)
Globulin, Total: 3 g/dL (ref 1.5–4.5)
Glucose: 101 mg/dL — ABNORMAL HIGH (ref 65–99)
Potassium: 4.4 mmol/L (ref 3.5–5.2)
Sodium: 137 mmol/L (ref 134–144)
Total Protein: 7.2 g/dL (ref 6.0–8.5)

## 2018-02-05 LAB — CBC WITH DIFFERENTIAL/PLATELET
BASOS: 1 %
Basophils Absolute: 0 10*3/uL (ref 0.0–0.2)
EOS (ABSOLUTE): 0.1 10*3/uL (ref 0.0–0.4)
Eos: 1 %
HEMATOCRIT: 40.8 % (ref 34.0–46.6)
HEMOGLOBIN: 14.2 g/dL (ref 11.1–15.9)
Immature Grans (Abs): 0 10*3/uL (ref 0.0–0.1)
Immature Granulocytes: 0 %
LYMPHS ABS: 1.6 10*3/uL (ref 0.7–3.1)
Lymphs: 26 %
MCH: 31.3 pg (ref 26.6–33.0)
MCHC: 34.8 g/dL (ref 31.5–35.7)
MCV: 90 fL (ref 79–97)
MONOCYTES: 9 %
Monocytes Absolute: 0.6 10*3/uL (ref 0.1–0.9)
NEUTROS ABS: 4 10*3/uL (ref 1.4–7.0)
Neutrophils: 63 %
Platelets: 298 10*3/uL (ref 150–450)
RBC: 4.53 x10E6/uL (ref 3.77–5.28)
RDW: 13.1 % (ref 12.3–15.4)
WBC: 6.3 10*3/uL (ref 3.4–10.8)

## 2018-02-05 LAB — TESTT+TESTF+SHBG
Sex Hormone Binding: 131.4 nmol/L — ABNORMAL HIGH (ref 24.6–122.0)
Testosterone, Free: 2.8 pg/mL (ref 0.0–4.2)
Testosterone, total: 22.4 ng/dL (ref 10.0–55.0)

## 2018-02-05 LAB — ESTRADIOL: ESTRADIOL: 156.1 pg/mL

## 2018-02-10 ENCOUNTER — Ambulatory Visit (INDEPENDENT_AMBULATORY_CARE_PROVIDER_SITE_OTHER): Payer: Managed Care, Other (non HMO) | Admitting: Family Medicine

## 2018-02-10 ENCOUNTER — Other Ambulatory Visit: Payer: Self-pay | Admitting: Family Medicine

## 2018-02-10 ENCOUNTER — Other Ambulatory Visit: Payer: Self-pay

## 2018-02-10 ENCOUNTER — Encounter: Payer: Self-pay | Admitting: Family Medicine

## 2018-02-10 VITALS — BP 104/69 | HR 71 | Temp 98.0°F | Resp 16 | Ht 66.5 in | Wt 195.0 lb

## 2018-02-10 DIAGNOSIS — F640 Transsexualism: Secondary | ICD-10-CM | POA: Diagnosis not present

## 2018-02-10 DIAGNOSIS — Z5181 Encounter for therapeutic drug level monitoring: Secondary | ICD-10-CM | POA: Diagnosis not present

## 2018-02-10 DIAGNOSIS — Z7952 Long term (current) use of systemic steroids: Secondary | ICD-10-CM | POA: Diagnosis not present

## 2018-02-10 DIAGNOSIS — Z789 Other specified health status: Secondary | ICD-10-CM

## 2018-02-10 DIAGNOSIS — Z79899 Other long term (current) drug therapy: Secondary | ICD-10-CM

## 2018-02-10 MED ORDER — ESTRADIOL 2 MG PO TABS: 4.0000 mg | ORAL_TABLET | Freq: Two times a day (BID) | ORAL | 1 refills | Status: DC

## 2018-02-10 MED ORDER — MEDROXYPROGESTERONE ACETATE 5 MG PO TABS: 5.0000 mg | ORAL_TABLET | Freq: Every day | ORAL | 1 refills | Status: DC

## 2018-02-10 MED ORDER — DUTASTERIDE 0.5 MG PO CAPS: 0.5000 mg | ORAL_CAPSULE | Freq: Every day | ORAL | 1 refills | Status: AC

## 2018-02-10 MED ORDER — SPIRONOLACTONE 100 MG PO TABS: 100.0000 mg | ORAL_TABLET | Freq: Two times a day (BID) | ORAL | 1 refills | Status: DC

## 2018-02-10 NOTE — Progress Notes (Addendum)
Subjective:  By signing my name below, I, [name], attest that this documentation has been prepared under the direction and in the presence of Delman Cheadle, MD Electronically Signed: Ladene Artist, ED Scribe 02/10/2018 at 12:15 PM.   Patient ID: Elizabeth Lane, adult    DOB: [birth year], 27 y.o.   MRN: [id number]  Chief Complaint  Patient presents with  . MEDICATION REVIEW    FOLLOW UP PROSCAR   Elizabeth Lane is a 27 y.o. adult who presents to Primary Care at [facility] for f/u. Last seen 3 months ago for hormone management. Has been on hormone therapy since late 2016. Estradiol increased from 4 to 6 mg qd around spring 2018. Provera was added after and spironolactone continued bid. Interested in switching to estradiol injections as she was hoping for more physical changes, increasing breast size and lowering libido. Tried first increasing estradiol form 6 to 8 mg and added Proscar to block masculinizing effects of testosterone. If ineffective then we would try transitioning to injectable estrogen. Has also been concerned about weight gain despite diet and exercise, concerned this was due to progesterone.  Pt states that she feels well overall. She is currently taking 1 Proscar every other day as daily dose caused swelling in her feet.  Weight States she has noticed that she has a lot more energy, she just needs to actually use it more. She plans to continue to work on eating habits and exercise more. Wt Readings from Last 3 Encounters:  02/10/18 165 lb 6.4 oz (75 kg)  11/18/17 194 lb 3.2 oz (88.1 kg)  11/04/17 192 lb 12.8 oz (87.5 kg)   No past medical history on file.   Current Outpatient Medications on File Prior to Visit  Medication Sig Dispense Refill  . diclofenac (VOLTAREN) 75 MG EC tablet Take 1 tablet (75 mg total) by mouth 3 (three) times daily as needed for mild pain. 60 tablet 0  . estradiol (ESTRACE) 2 MG tablet Take 2 tablets (4 mg total) by mouth 2 (two) times daily.  360 tablet 0  . finasteride (PROSCAR) 5 MG tablet Take 1 tablet (5 mg total) by mouth daily. 90 tablet 0  . medroxyPROGESTERone (PROVERA) 5 MG tablet Take 1 tablet (5 mg total) by mouth daily. 90 tablet 1  . spironolactone (ALDACTONE) 100 MG tablet Take 1 tablet (100 mg total) by mouth 2 (two) times daily. 180 tablet 0  . estradiol (ESTRACE) 2 MG tablet Take 2 tablets (4 mg total) by mouth 2 (two) times daily. **NEEDS OFFICE VISIT FOR ADDITIONAL REFILLS** 360 tablet 0   No current facility-administered medications on file prior to visit.    Allergies  Allergen Reactions  . Lactose Intolerance (Gi)     GI issues   Review of Systems  Cardiovascular: Negative for leg swelling (resolved).      Objective:   Physical Exam  Constitutional: She is oriented to person, place, and time. She appears well-developed and well-nourished. No distress.  HENT:  Head: Normocephalic and atraumatic.  Eyes: Conjunctivae and EOM are normal.  Neck: Neck supple. No tracheal deviation present. No thyroid mass and no thyromegaly present.  Cardiovascular: Normal rate and regular rhythm.  Pulmonary/Chest: Effort normal and breath sounds normal. No respiratory distress.  Musculoskeletal: Normal range of motion.  Neurological: She is alert and oriented to person, place, and time.  Skin: Skin is warm and dry.  Psychiatric: She has a normal mood and affect. Her behavior is normal.  Nursing note  and vitals reviewed.   BP 104/69 (BP Location: Right Arm)   Pulse 71   Temp 98 F (36.7 C) (Oral)   Resp 16   Ht 5' 6.5" (1.689 m)   Wt 165 lb 6.4 oz (75 kg)   SpO2 95%   BMI 26.30 kg/m     Assessment & Plan:  Entered future labs.  1. Long term (current) use of systemic steroids   2. High risk medication use   3. Medication monitoring encounter   4. Female-to-female transgender person     Orders Placed This Encounter  Procedures  . Comprehensive metabolic panel    Standing Status:   Future    Standing  Expiration Date:   06/06/2019  . CBC with Differential/Platelet    Standing Status:   Future    Standing Expiration Date:   06/06/2019  . TestT+TestF+SHBG    Standing Status:   Future    Standing Expiration Date:   06/06/2019  . Estradiol    Standing Status:   Future    Standing Expiration Date:   06/06/2019    Meds ordered this encounter  Medications  . DISCONTD: medroxyPROGESTERone (PROVERA) 5 MG tablet    Sig: Take 1 tablet (5 mg total) by mouth daily.    Dispense:  90 tablet    Refill:  1  . spironolactone (ALDACTONE) 100 MG tablet    Sig: Take 1 tablet (100 mg total) by mouth 2 (two) times daily.    Dispense:  180 tablet    Refill:  1  . DISCONTD: estradiol (ESTRACE) 2 MG tablet    Sig: Take 2 tablets (4 mg total) by mouth 2 (two) times daily.    Dispense:  360 tablet    Refill:  1  . dutasteride (AVODART) 0.5 MG capsule    Sig: Take 1 capsule (0.5 mg total) by mouth daily.    Dispense:  90 capsule    Refill:  1   I personally performed the services described in this documentation, which was scribed in my presence. The recorded information has been reviewed and considered, and addended by me as needed.   Delman Cheadle, MD, MPH Primary Care at [place], [place]  [phone] Office phone  [phone] Office fax   06/02/18 6:01 AM

## 2018-02-10 NOTE — Patient Instructions (Signed)
     IF you received an x-ray today, you will receive an invoice from [facility]. Please contact [facility] at [phone] with questions or concerns regarding your invoice.   IF you received labwork today, you will receive an invoice from [facility]. Please contact [facility] at [phone] with questions or concerns regarding your invoice.   Our billing staff will not be able to assist you with questions regarding bills from these companies.  You will be contacted with the lab results as soon as they are available. The fastest way to get your results is to activate your My Chart account. Instructions are located on the last page of this paperwork. If you have not heard from us regarding the results in 2 weeks, please contact this office.     

## 2018-05-05 NOTE — Progress Notes (Signed)
Chief Complaint  Patient presents with  . discuss meds    needs refill on medroxyprogesterone    HPI  This is a MtoF transgender patient here for hormone refill and monitoring Denies any hotflashes, palpitations, mood swings, unusual hair growth  Is currently doing well and happy with the results Depression screen [facility] 2/9 05/06/2018 02/10/2018 11/04/2017  Decreased Interest 0 0 0  Down, Depressed, Hopeless 0 0 0  PHQ - 2 Score 0 0 0      No past medical history on file.  Current Outpatient Medications  Medication Sig Dispense Refill  . dutasteride (AVODART) 0.5 MG capsule Take 1 capsule (0.5 mg total) by mouth daily. 90 capsule 1  . estradiol (ESTRACE) 2 MG tablet Take 2 tablets (4 mg total) by mouth 2 (two) times daily. 360 tablet 1  . medroxyPROGESTERone (PROVERA) 5 MG tablet Take 1 tablet (5 mg total) by mouth daily. 90 tablet 1  . spironolactone (ALDACTONE) 100 MG tablet Take 1 tablet (100 mg total) by mouth 2 (two) times daily. 180 tablet 1   No current facility-administered medications for this visit.     Allergies:  Allergies  Allergen Reactions  . Lactose Intolerance (Gi)     GI issues    No past surgical history on file.  Social History   Socioeconomic History  . Marital status: Single    Spouse name: Not on file  . Number of children: Not on file  . Years of education: Not on file  . Highest education level: Not on file  Occupational History  . Not on file  Social Needs  . Financial resource strain: Not on file  . Food insecurity:    Worry: Not on file    Inability: Not on file  . Transportation needs:    Medical: Not on file    Non-medical: Not on file  Tobacco Use  . Smoking status: Never Smoker  . Smokeless tobacco: Never Used  Substance and Sexual Activity  . Alcohol use: Yes    Frequency: Never    Comment: 1 a month/ socailly  . Drug use: No  . Sexual activity: Yes  Lifestyle  . Physical activity:    Days per week: Not on file   Minutes per session: Not on file  . Stress: Not on file  Relationships  . Social connections:    Talks on phone: Not on file    Gets together: Not on file    Attends religious service: Not on file    Active member of club or organization: Not on file    Attends meetings of clubs or organizations: Not on file    Relationship status: Not on file  Other Topics Concern  . Not on file  Social History Narrative  . Not on file    Family History  Problem Relation Age of Onset  . Cancer Mother      ROS Review of Systems See HPI Constitution: No fevers or chills No malaise No diaphoresis Skin: No rash or itching Eyes: no blurry vision, no double vision GU: no dysuria or hematuria Neuro: no dizziness or headaches all others reviewed and negative   Objective: Vitals:   05/06/18 1034  BP: 112/73  Pulse: 80  Resp: 17  Temp: 98 F (36.7 C)  TempSrc: Oral  SpO2: 94%  Weight: 192 lb 3.2 oz (87.2 kg)  Height: 5' 6.5" (1.689 m)   Wt Readings from Last 3 Encounters:  05/06/18 192 lb 3.2 oz (87.2  kg)  02/10/18 195 lb (88.5 kg)  11/18/17 194 lb 3.2 oz (88.1 kg)    Physical Exam  Constitutional: She is oriented to person, place, and time. She appears well-developed and well-nourished.  HENT:  Head: Normocephalic and atraumatic.  Eyes: Conjunctivae and EOM are normal.  Neck: Normal range of motion. Neck supple.  Cardiovascular: Normal rate, regular rhythm and normal heart sounds.  No murmur heard. Pulmonary/Chest: Effort normal and breath sounds normal. No stridor. No respiratory distress. She has no wheezes.  Neurological: She is alert and oriented to person, place, and time.  Skin: Skin is warm. Capillary refill takes less than 2 seconds.  Psychiatric: She has a normal mood and affect. Her behavior is normal. Judgment and thought content normal.    Assessment and Plan Elizabeth Lane was seen today for discuss meds.  Diagnoses and all orders for this visit:  Female-to-female  transgender person -     medroxyPROGESTERone (PROVERA) 5 MG tablet; Take 1 tablet (5 mg total) by mouth daily. -     estradiol (ESTRACE) 2 MG tablet; Take 2 tablets (4 mg total) by mouth 2 (two) times daily.  Medication monitoring encounter -     Comprehensive metabolic panel -     CBC -     TestT+TestF+SHBG  Long term (current) use of systemic steroids  Endocrine disorder -     medroxyPROGESTERone (PROVERA) 5 MG tablet; Take 1 tablet (5 mg total) by mouth daily. -     estradiol (ESTRACE) 2 MG tablet; Take 2 tablets (4 mg total) by mouth 2 (two) times daily.  Stable on current hormone regimen Discussed when to notify us of concerns Discussed monitoring labs  Continue current regimen   Rosemarie Galvis A Nolon Rod

## 2018-05-06 ENCOUNTER — Ambulatory Visit (INDEPENDENT_AMBULATORY_CARE_PROVIDER_SITE_OTHER): Payer: Managed Care, Other (non HMO) | Admitting: Family Medicine

## 2018-05-06 ENCOUNTER — Other Ambulatory Visit: Payer: Self-pay

## 2018-05-06 ENCOUNTER — Encounter: Payer: Self-pay | Admitting: Family Medicine

## 2018-05-06 VITALS — BP 112/73 | HR 80 | Temp 98.0°F | Resp 17 | Ht 66.5 in | Wt 192.2 lb

## 2018-05-06 DIAGNOSIS — Z5181 Encounter for therapeutic drug level monitoring: Secondary | ICD-10-CM

## 2018-05-06 DIAGNOSIS — E349 Endocrine disorder, unspecified: Secondary | ICD-10-CM

## 2018-05-06 DIAGNOSIS — F640 Transsexualism: Secondary | ICD-10-CM

## 2018-05-06 DIAGNOSIS — Z7952 Long term (current) use of systemic steroids: Secondary | ICD-10-CM

## 2018-05-06 DIAGNOSIS — Z789 Other specified health status: Secondary | ICD-10-CM

## 2018-05-06 MED ORDER — MEDROXYPROGESTERONE ACETATE 5 MG PO TABS: 5.0000 mg | ORAL_TABLET | Freq: Every day | ORAL | 1 refills | Status: AC

## 2018-05-06 MED ORDER — ESTRADIOL 2 MG PO TABS: 4.0000 mg | ORAL_TABLET | Freq: Two times a day (BID) | ORAL | 1 refills | Status: DC

## 2018-05-06 NOTE — Patient Instructions (Signed)
° ° ° °  If you have lab work done today you will be contacted with your lab results within the next 2 weeks.  If you have not heard from us then please contact us. The fastest way to get your results is to register for My Chart. ° ° °IF you received an x-ray today, you will receive an invoice from [facility]. Please contact [facility] at [phone] with questions or concerns regarding your invoice.  ° °IF you received labwork today, you will receive an invoice from [facility]. Please contact [facility] at [phone] with questions or concerns regarding your invoice.  ° °Our billing staff will not be able to assist you with questions regarding bills from these companies. ° °You will be contacted with the lab results as soon as they are available. The fastest way to get your results is to activate your My Chart account. Instructions are located on the last page of this paperwork. If you have not heard from us regarding the results in 2 weeks, please contact this office. °  ° ° ° °

## 2018-05-10 LAB — COMPREHENSIVE METABOLIC PANEL
ALK PHOS: 46 IU/L (ref 39–117)
ALT: 30 IU/L (ref 0–32)
AST: 19 IU/L (ref 0–40)
Albumin/Globulin Ratio: 1.6 (ref 1.2–2.2)
Albumin: 4.5 g/dL (ref 3.5–5.5)
BUN / CREAT RATIO: 13 (ref 9–23)
BUN: 10 mg/dL (ref 6–20)
Bilirubin Total: 0.6 mg/dL (ref 0.0–1.2)
CALCIUM: 9.6 mg/dL (ref 8.7–10.2)
CO2: 20 mmol/L (ref 20–29)
Chloride: 103 mmol/L (ref 96–106)
Creatinine, Ser: 0.8 mg/dL (ref 0.57–1.00)
GFR, EST AFRICAN AMERICAN: 118 mL/min/{1.73_m2} (ref >59)
GFR, EST NON AFRICAN AMERICAN: 102 mL/min/{1.73_m2} (ref >59)
GLOBULIN, TOTAL: 2.8 g/dL (ref 1.5–4.5)
GLUCOSE: 108 mg/dL — AB (ref 65–99)
Potassium: 4.4 mmol/L (ref 3.5–5.2)
SODIUM: 138 mmol/L (ref 134–144)
TOTAL PROTEIN: 7.3 g/dL (ref 6.0–8.5)

## 2018-05-10 LAB — CBC
HEMATOCRIT: 42.1 % (ref 34.0–46.6)
Hemoglobin: 14.6 g/dL (ref 11.1–15.9)
MCH: 31.2 pg (ref 26.6–33.0)
MCHC: 34.7 g/dL (ref 31.5–35.7)
MCV: 90 fL (ref 79–97)
PLATELETS: 295 10*3/uL (ref 150–450)
RBC: 4.68 x10E6/uL (ref 3.77–5.28)
RDW: 13 % (ref 12.3–15.4)
WBC: 7.9 10*3/uL (ref 3.4–10.8)

## 2018-05-10 LAB — TESTT+TESTF+SHBG
SEX HORMONE BINDING: 122.8 nmol/L — AB (ref 24.6–122.0)
Testosterone, Free: 2.1 pg/mL (ref 0.0–4.2)
Testosterone, total: 23.3 ng/dL (ref 10.0–55.0)

## 2018-06-08 IMAGING — DX DG FOOT COMPLETE 3+V*R*
3 series · 3 of 3 positions shown · non-contrast
Comparison: None in PACs

CLINICAL DATA: Acute onset of pain 5 days ago centered over the
proximal aspect of the fifth metatarsal.

EXAM:
RIGHT FOOT COMPLETE - 3+ VIEW

[foot ap]
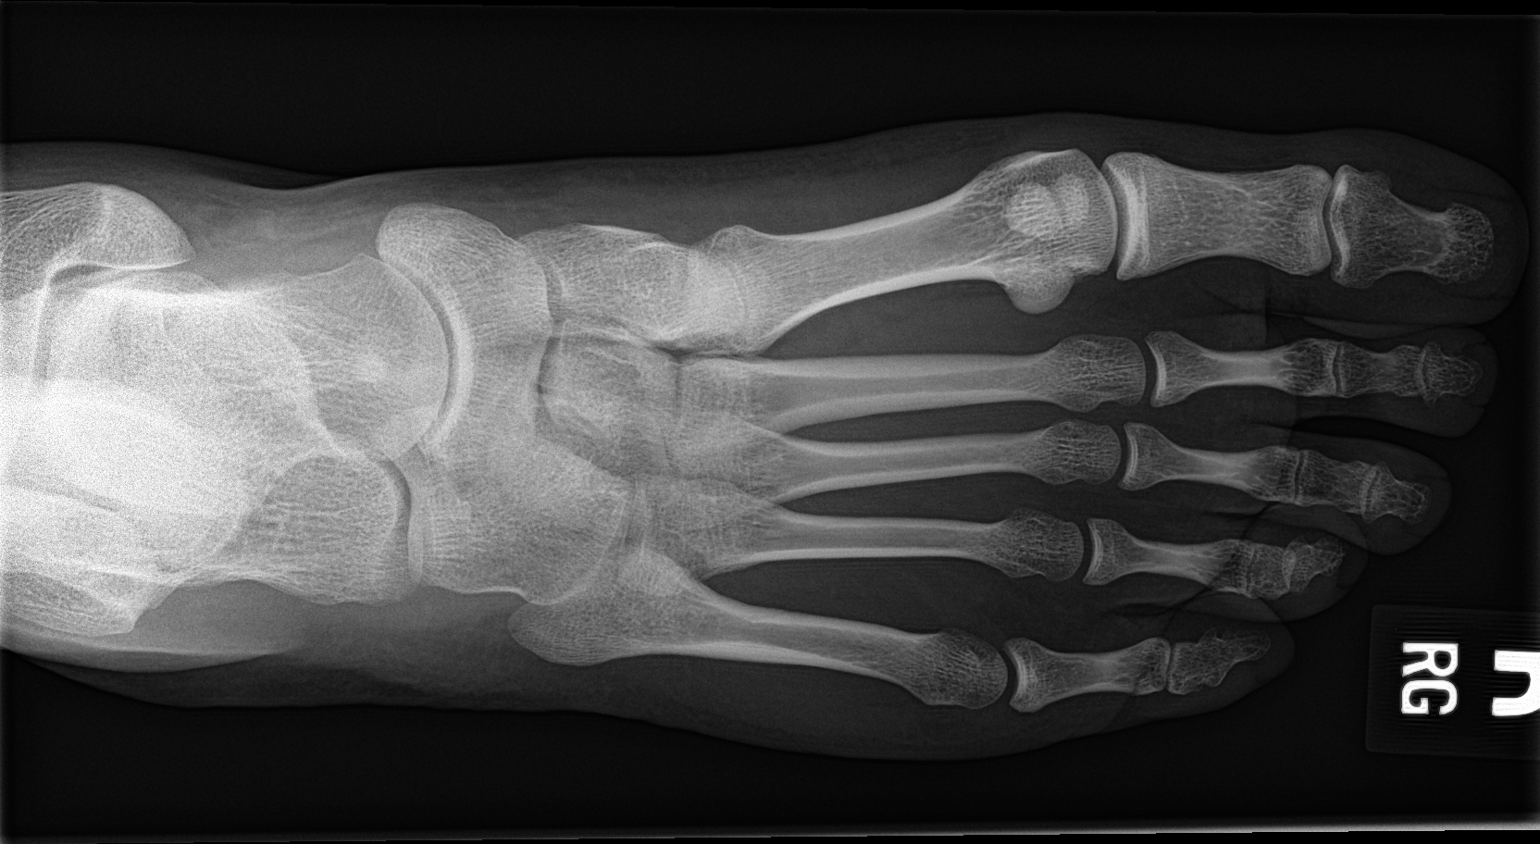

[foot obl]
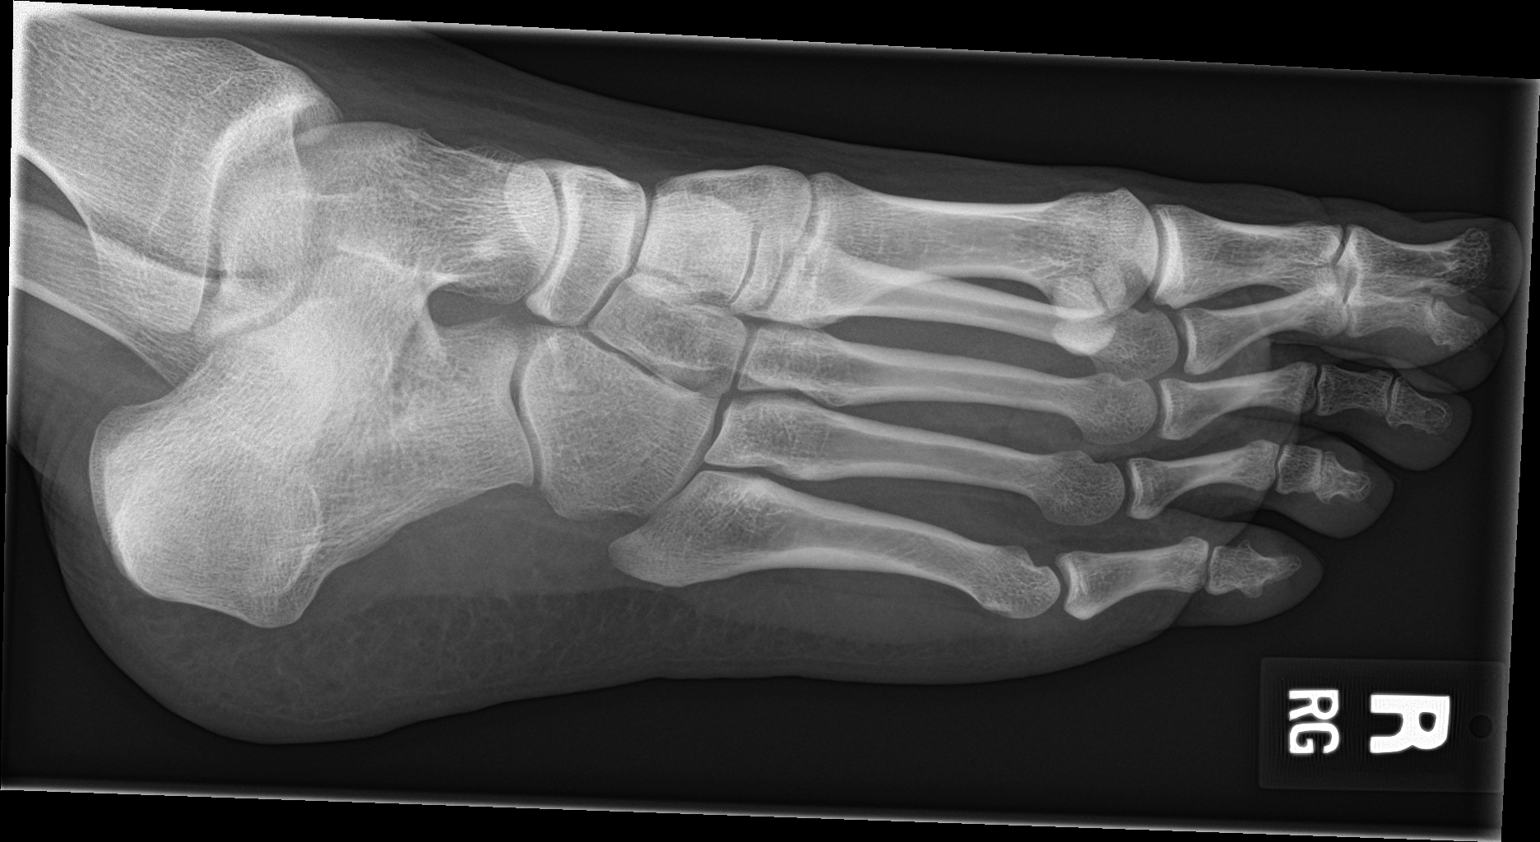

[foot lat]
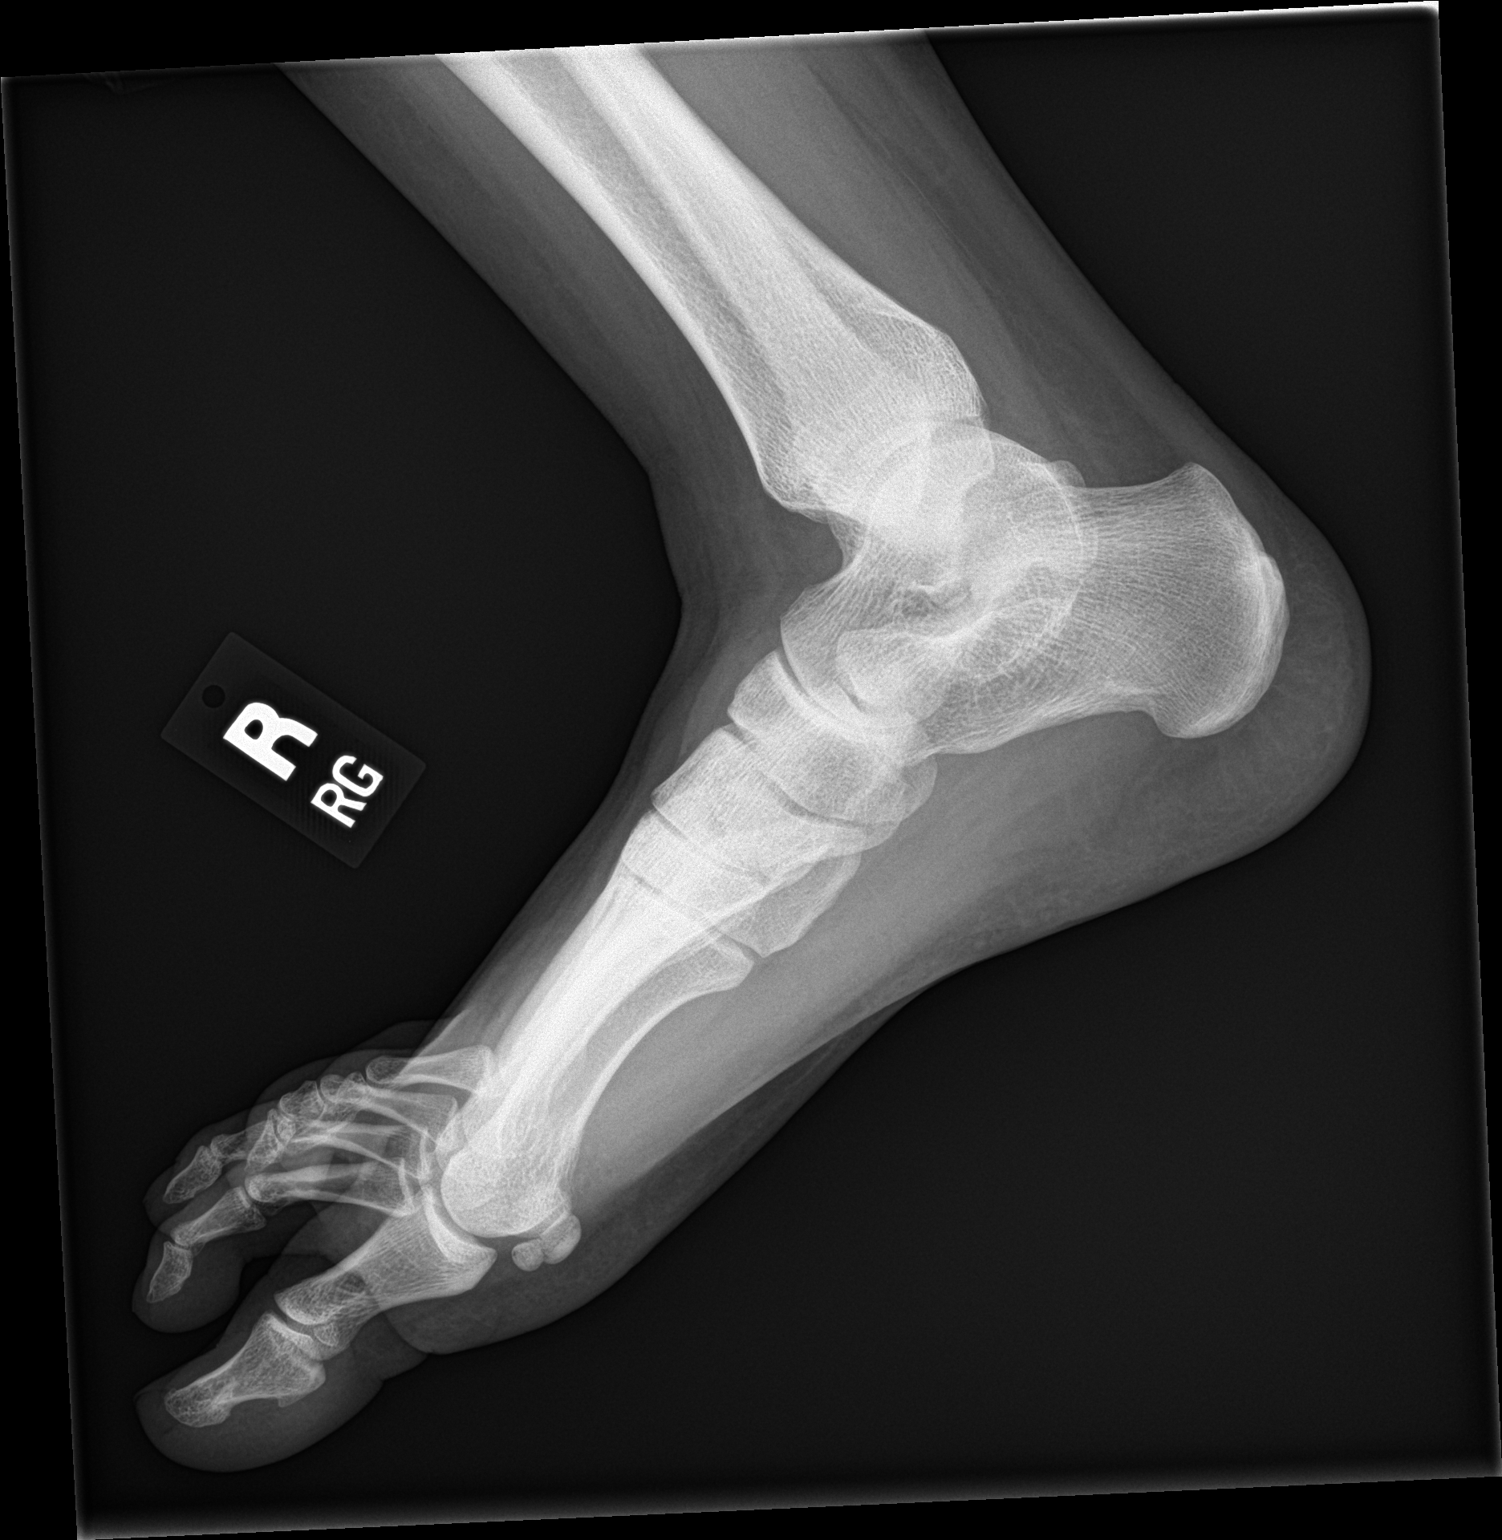

[3 of 3 positions shown; findings below may reference images not displayed]

FINDINGS: The bones are subjectively adequately mineralized. The joint spaces
are well maintained. There is no acute or healing fracture. Specific
attention to the fifth metatarsal reveals no acute bony abnormality.
The tarsometatarsal and metatarsophalangeal joints high data mid of
the fifth ray appear normal. The soft tissues are unremarkable.
IMPRESSION: There is no acute or significant chronic bony abnormality of the
right foot. Specific attention to the fifth ray reveals no acute
abnormality.

## 2018-06-20 ENCOUNTER — Telehealth: Payer: Self-pay | Admitting: Family Medicine

## 2018-06-20 NOTE — Telephone Encounter (Signed)
Copied from [place] [id number]. Topic: Quick Communication - See Telephone Encounter >> Jun 20, 2018 11:23 AM Chauncey Mann A wrote: CRM for notification. See Telephone encounter for: 06/20/18. Pt is currently taking estradiol (ESTRACE) 2 MG tablet and would like to switch to injectables in the new year, however, first she would like to know estimated cost.  Please return pt. Call to discuss further.

## 2018-06-20 NOTE — Telephone Encounter (Signed)
Left detailed message patient would need to call insurance company to see if covered and get a price we do not have prices on medications

## 2018-06-21 NOTE — Telephone Encounter (Signed)
Pt would like a call to discuss what possible injections she would be on and what dosage so that she may verify with the insurance company if they will cover Please advise.

## 2018-06-23 ENCOUNTER — Encounter: Payer: Self-pay | Admitting: Family Medicine

## 2018-06-23 NOTE — Telephone Encounter (Addendum)
Sent pt MyChart note with below info:  There are some sig risks/drawback to changing to injectable estrogen so whether we decide to do this is something that I would need to discuss w/ her at her next OV - but VERY smart of her to be prepared to know if the change would even be a financial option for her before going through all this discussion and planning. Most women are put on estradiol valerate (brand name Delestrogen) She is currently on oral estradiol 4mg  bid - this would be equivalent to estradiol valerate 20mg  IM/Boody (doesn't matter) WEEKLY - comes in both a 20mg /mL and 40mg /ml solution so can use whichever is cheapest but usu when on such a high dose use the 40mg /ml preparation since have to inject less so less painful, better absorption, and quantity will last twice as long. Of course, she will also need 1 ml syringes and 2 different needles (usu 18g 1 1/2 in and 23g 3/4in or 1 in -  but these can be purchased through insurance w/ rx but also can be bought legally w/o rx so some people get cheaper off amazon. . . .

## 2018-08-07 ENCOUNTER — Other Ambulatory Visit: Payer: Self-pay | Admitting: Family Medicine

## 2018-08-08 NOTE — Telephone Encounter (Signed)
Requested Prescriptions  Pending Prescriptions Disp Refills  . spironolactone (ALDACTONE) 100 MG tablet [Pharmacy Med Name: SPIRONOLACTONE 100MG  TABLETS] 180 tablet 0    Sig: TAKE 1 TABLET(100 MG) BY MOUTH TWICE DAILY     Cardiovascular: Diuretics - Aldosterone Antagonist Passed - 08/07/2018  5:47 AM      Passed - Cr in normal range and within 360 days    Creatinine, Ser  Date Value Ref Range Status  05/06/2018 0.80 0.57 - 1.00 mg/dL Final         Passed - K in normal range and within 360 days    Potassium  Date Value Ref Range Status  05/06/2018 4.4 3.5 - 5.2 mmol/L Final         Passed - Na in normal range and within 360 days    Sodium  Date Value Ref Range Status  05/06/2018 138 134 - 144 mmol/L Final         Passed - Last BP in normal range    BP Readings from Last 1 Encounters:  05/06/18 112/73         Passed - Valid encounter within last 6 months    Recent Outpatient Visits          3 months ago Female-to-female transgender person   Primary [facility] at [facility], [place], [place]   5 months ago Long term (current) use of systemic steroids   [facility] at [facility], Laurey Arrow, MD   6 months ago Long term (current) use of systemic steroids   [facility], Laurey Arrow, [place]   8 months ago Acute foot pain, right   [facility] at [facility], Laurey Arrow, MD   9 months ago Endocrine disorder   [facility] at [facility], Laurey Arrow, MD      Future Appointments            In 1 week Shawnee Knapp, MD [facility] at [place], [facility]

## 2018-08-18 ENCOUNTER — Ambulatory Visit (INDEPENDENT_AMBULATORY_CARE_PROVIDER_SITE_OTHER): Payer: Managed Care, Other (non HMO) | Admitting: Family Medicine

## 2018-08-18 ENCOUNTER — Encounter: Payer: Self-pay | Admitting: Family Medicine

## 2018-08-18 ENCOUNTER — Other Ambulatory Visit: Payer: Self-pay

## 2018-08-18 VITALS — BP 121/74 | HR 76 | Temp 98.0°F | Resp 16 | Ht 66.0 in | Wt 193.0 lb

## 2018-08-18 DIAGNOSIS — Z79899 Other long term (current) drug therapy: Secondary | ICD-10-CM

## 2018-08-18 DIAGNOSIS — Z7952 Long term (current) use of systemic steroids: Secondary | ICD-10-CM

## 2018-08-18 DIAGNOSIS — R238 Other skin changes: Secondary | ICD-10-CM

## 2018-08-18 DIAGNOSIS — F640 Transsexualism: Secondary | ICD-10-CM | POA: Diagnosis not present

## 2018-08-18 DIAGNOSIS — D171 Benign lipomatous neoplasm of skin and subcutaneous tissue of trunk: Secondary | ICD-10-CM

## 2018-08-18 DIAGNOSIS — Z789 Other specified health status: Secondary | ICD-10-CM

## 2018-08-18 MED ORDER — "NEEDLE (DISP) 18G X 1-1/2"" MISC": 1.0000 [IU] | 0 refills | Status: DC | PRN

## 2018-08-18 MED ORDER — SYRINGE (DISPOSABLE) 1 ML MISC: 1.0000 [IU] | 0 refills | Status: DC | PRN

## 2018-08-18 MED ORDER — "NEEDLE (DISP) 18G X 1-1/2"" MISC": 1.0000 [IU] | 0 refills | Status: AC | PRN

## 2018-08-18 MED ORDER — "NEEDLE (DISP) 23G X 3/4"" MISC": 1.0000 [IU] | 0 refills | Status: AC | PRN

## 2018-08-18 MED ORDER — "NEEDLE (DISP) 23G X 3/4"" MISC": 1.0000 [IU] | 0 refills | Status: DC | PRN

## 2018-08-18 MED ORDER — ESTRADIOL VALERATE 20 MG/ML IM OIL: 10.0000 mg | TOPICAL_OIL | INTRAMUSCULAR | 12 refills | Status: AC

## 2018-08-18 MED ORDER — ESTRADIOL VALERATE 20 MG/ML IM OIL: 10.0000 mg | TOPICAL_OIL | INTRAMUSCULAR | 12 refills | Status: DC

## 2018-08-18 MED ORDER — SYRINGE (DISPOSABLE) 1 ML MISC: 1.0000 [IU] | 0 refills | Status: AC | PRN

## 2018-08-18 NOTE — Progress Notes (Signed)
Subjective:    Patient: Elizabeth Lane  DOB: 1991-06-24; 27 y.o.   MRN: [id number]  Chief Complaint  Patient presents with  . Female to Female Transgender    6 month follow-up     HPI  Over the last 3 years that she has done oral estradiol as she never experienced some of the changes she was hoping to - she never experienced the physical and mental changes - has always been emotional person and so never felt like that stayed the same.  So wonders if the body fat redistribution and muscle mass could be improved  Small bump on her right ankle.  For years she has had a lump beneath her right breast. Has been the same over place over the past 2 yrs, not growing, not painful. Has had several reassurance and no further studies.   Medical History History reviewed. No pertinent past medical history. History reviewed. No pertinent surgical history. Current Outpatient Medications on File Prior to Visit  Medication Sig Dispense Refill  . dutasteride (AVODART) 0.5 MG capsule Take 1 capsule (0.5 mg total) by mouth daily. 90 capsule 1  . estradiol (ESTRACE) 2 MG tablet Take 2 tablets (4 mg total) by mouth 2 (two) times daily. 360 tablet 1  . medroxyPROGESTERone (PROVERA) 5 MG tablet Take 1 tablet (5 mg total) by mouth daily. 90 tablet 1  . spironolactone (ALDACTONE) 100 MG tablet TAKE 1 TABLET(100 MG) BY MOUTH TWICE DAILY 180 tablet 0   No current facility-administered medications on file prior to visit.    Allergies  Allergen Reactions  . Lactose Intolerance (Gi)     GI issues   Family History  Problem Relation Age of Onset  . Cancer Mother    Social History   Socioeconomic History  . Marital status: Single    Spouse name: Not on file  . Number of children: Not on file  . Years of education: Not on file  . Highest education level: Not on file  Occupational History  . Not on file  Social Needs  . Financial resource strain: Not on file  . Food insecurity:    Worry: Not on file      Inability: Not on file  . Transportation needs:    Medical: Not on file    Non-medical: Not on file  Tobacco Use  . Smoking status: Never Smoker  . Smokeless tobacco: Never Used  Substance and Sexual Activity  . Alcohol use: Yes    Frequency: Never    Comment: 1 a month/ socailly  . Drug use: No  . Sexual activity: Yes  Lifestyle  . Physical activity:    Days per week: Not on file    Minutes per session: Not on file  . Stress: Not on file  Relationships  . Social connections:    Talks on phone: Not on file    Gets together: Not on file    Attends religious service: Not on file    Active member of club or organization: Not on file    Attends meetings of clubs or organizations: Not on file    Relationship status: Not on file  Other Topics Concern  . Not on file  Social History Narrative  . Not on file   Depression screen [facility] 2/9 05/06/2018 02/10/2018 11/04/2017  Decreased Interest 0 0 0  Down, Depressed, Hopeless 0 0 0  PHQ - 2 Score 0 0 0    ROS As noted in HPI  Objective:  BP 121/74   Pulse 76   Temp 98 F (36.7 C) (Oral)   Resp 16   Ht 5\' 6"  (1.676 m)   Wt 193 lb (87.5 kg)   SpO2 96%   BMI 31.15 kg/m  Physical Exam Constitutional:      General: She is not in acute distress.    Appearance: She is well-developed. She is not diaphoretic.  HENT:     Head: Normocephalic and atraumatic.     Right Ear: External ear normal.     Left Ear: External ear normal.  Eyes:     General: No scleral icterus.    Conjunctiva/sclera: Conjunctivae normal.  Neck:     Musculoskeletal: Normal range of motion and neck supple.     Thyroid: No thyromegaly.  Cardiovascular:     Rate and Rhythm: Normal rate and regular rhythm.     Heart sounds: Normal heart sounds.  Pulmonary:     Effort: Pulmonary effort is normal. No respiratory distress.     Breath sounds: Normal breath sounds.  Chest:    Lymphadenopathy:     Cervical: No cervical adenopathy.  Skin:    General: Skin  is warm and dry.     Findings: No erythema.     Comments: 68mm skin colored papule on right anterior ankle  Neurological:     Mental Status: She is alert and oriented to person, place, and time.  Psychiatric:        Behavior: Behavior normal.     POC TESTING No visits with results within 3 Day(s) from this visit.  Latest known visit with results is:  Office Visit on 05/06/2018  Component Date Value Ref Range Status  . Glucose 05/06/2018 108* 65 - 99 mg/dL Final  . BUN 05/06/2018 10  6 - 20 mg/dL Final  . Creatinine, Ser 05/06/2018 0.80  0.57 - 1.00 mg/dL Final  . GFR calc non Af Amer 05/06/2018 102  >59 mL/min/1.73 Final  . GFR calc Af Amer 05/06/2018 118  >59 mL/min/1.73 Final  . BUN/Creatinine Ratio 05/06/2018 13  9 - 23 Final  . Sodium 05/06/2018 138  134 - 144 mmol/L Final  . Potassium 05/06/2018 4.4  3.5 - 5.2 mmol/L Final  . Chloride 05/06/2018 103  96 - 106 mmol/L Final  . CO2 05/06/2018 20  20 - 29 mmol/L Final  . Calcium 05/06/2018 9.6  8.7 - 10.2 mg/dL Final  . Total Protein 05/06/2018 7.3  6.0 - 8.5 g/dL Final  . Albumin 05/06/2018 4.5  3.5 - 5.5 g/dL Final  . Globulin, Total 05/06/2018 2.8  1.5 - 4.5 g/dL Final  . Albumin/Globulin Ratio 05/06/2018 1.6  1.2 - 2.2 Final  . Bilirubin Total 05/06/2018 0.6  0.0 - 1.2 mg/dL Final  . Alkaline Phosphatase 05/06/2018 46  39 - 117 IU/L Final  . AST 05/06/2018 19  0 - 40 IU/L Final  . ALT 05/06/2018 30  0 - 32 IU/L Final  . WBC 05/06/2018 7.9  3.4 - 10.8 x10E3/uL Final  . RBC 05/06/2018 4.68  3.77 - 5.28 x10E6/uL Final  . Hemoglobin 05/06/2018 14.6  11.1 - 15.9 g/dL Final  . Hematocrit 05/06/2018 42.1  34.0 - 46.6 % Final  . MCV 05/06/2018 90  79 - 97 fL Final  . MCH 05/06/2018 31.2  26.6 - 33.0 pg Final  . MCHC 05/06/2018 34.7  31.5 - 35.7 g/dL Final  . RDW 05/06/2018 13.0  12.3 - 15.4 % Final  . Platelets 05/06/2018 295  150 - 450 x10E3/uL Final  . Testosterone, total 05/06/2018 23.3  10.0 - 55.0 ng/dL Final   Comment:  This test was developed and its performance characteristics determined by [facility]. It has not been cleared or approved by the Food and Drug Administration.   . Testosterone, Free 05/06/2018 2.1  0.0 - 4.2 pg/mL Final  . Sex Hormone Binding 05/06/2018 122.8* 24.6 - 122.0 nmol/L Final     Assessment & Plan:   1. Female-to-female transgender person - pt again requests to transition to estradiol - was initially going to rx estradiol cypionate with q2wk dosing but the estradiol valerate appears to be lower cost tier on her ins and pt w/o concerns about the ability to self-administer IM med so will instead start estradiol valerate qwk.  Stop estradiol 4mg  po bid (max dose and serum level prev at goal) and transition to mid-range dose on IM estradiol valerate of 10mg  qwk.  Reviewed risks of unintentionally obtaining supraphysiologic levels so importance of rechecking level at midway point between injections asap >6 wks after starting inj (tomorrow 12/27 - so recheck ~3/1 - will be 3-4d after inj since on qwk dosing.  Pt will come in to office tomorrow after picking up med for cma teaching on how to administer IM med and will give handout at that time.   On estradiol 2mg  bid last estadiol level 6 mos prior high nml at 156.1 pg/mL  Hopefully after starting IM estradiol after the first recheck, anticipate that we may be able to back off or stop on of the 3 anti-androgens that pt is on - is having sig side effects of lightheadedness to avodart so only able to take qod so doubt that is doing much effect in decreasing the conversion of testosterone to its active form.  Also would be good to stop provera at some point due to increased risks of vascular events, weight gain, and adverse mood efffects w/.progesterone as well as no proven benefit.  Pt will retain any improvements in breast size, shape, aerolar that she experiences while on it. . .  No need to check labs today since changing estrogen regimen so  drastically on pt preference.  2. Long term (current) use of systemic steroids -reviewed risks of elevated estrogen - most acutely is DVT/PE -  gave work note so pt can have a standing desk which will help her move more freq and decrease thromboembolism risk of being sedentary behind computer for entire work day  3. High risk medication use   4. Papule of skin on anterior ankle- no red flags - reassured that likely tiny inclusion cyst or granulomatous reaction to microscopic inj  5. Lipoma of anterior chest wall - reassured    Patient will continue on current chronic medications other than changes noted above, so ok to refill when needed.   See after visit summary for patient specific instructions.  Future lab orders entered in case pt can have drawn before next visit  Meds ordered this encounter  Medications  . DISCONTD: estradiol valerate (DELESTROGEN) 20 MG/ML injection    Sig: Inject 0.5 mLs (10 mg total) into the muscle once a week.    Dispense:  5 mL    Refill:  12  . DISCONTD: Syringe, Disposable, 1 ML MISC    Sig: 1 Units by Does not apply route as needed.    Dispense:  100 each    Refill:  0  . DISCONTD: NEEDLE, DISP, 23 G 23G X 3/4" MISC  Sig: 1 Units by Does not apply route as needed.    Dispense:  100 each    Refill:  0  . DISCONTD: NEEDLE, DISP, 18 G 18G X 1-1/2" MISC    Sig: 1 Units by Does not apply route as needed.    Dispense:  50 each    Refill:  0  . Syringe, Disposable, 1 ML MISC    Sig: 1 Units by Does not apply route as needed.    Dispense:  100 each    Refill:  0  . NEEDLE, DISP, 23 G 23G X 3/4" MISC    Sig: 1 Units by Does not apply route as needed.    Dispense:  100 each    Refill:  0  . NEEDLE, DISP, 18 G 18G X 1-1/2" MISC    Sig: 1 Units by Does not apply route as needed.    Dispense:  50 each    Refill:  0  . estradiol valerate (DELESTROGEN) 20 MG/ML injection    Sig: Inject 0.5 mLs (10 mg total) into the muscle once a week.    Dispense:  5  mL    Refill:  12    Patient verbalized to me that they understand the following: diagnosis, what is being done for them, what to expect and what should be done at home.  Their questions have been answered. They understand that I am unable to predict every possible medication interaction or adverse outcome and that if any unexpected symptoms arise, they should contact us and their pharmacist, as well as never hesitate to seek urgent/emergent care at [facility] or ER if they think it might be warranted.    Delman Cheadle, MD, MPH Primary Care at [facility] [address] [place], [place]  [id number] [phone] Office phone  [phone] Office fax  08/18/18 11:42 AM

## 2018-08-18 NOTE — Patient Instructions (Addendum)
We will start you on 0.5 mL of the estradiol valerate weekly (was a cheaper insurance price than the longer lasting formulation estradiol cypionate which was a preferred level 1 though if you would rather do less frequent injections - the every other week as we had originally discussed in our visit I am happy to switch you - just let me know).  Therefore - in 8 weeks, come in for labs about 3-4 days after your injection. Make a 2 month follow-up visit with me and we can review the results at that time.   If you have lab work done today you will be contacted with your lab results within the next 2 weeks.  If you have not heard from [place] then please contact us. The fastest way to get your results is to register for My Chart.   IF you received an x-ray today, you will receive an invoice from [facility]. Please contact [facility] at [phone] with questions or concerns regarding your invoice.   IF you received labwork today, you will receive an invoice from [facility]. Please contact [facility] at [phone] with questions or concerns regarding your invoice.   Our billing staff will not be able to assist you with questions regarding bills from these companies.  You will be contacted with the lab results as soon as they are available. The fastest way to get your results is to activate your My Chart account. Instructions are located on the last page of this paperwork. If you have not heard from [place] regarding the results in 2 weeks, please contact this office.      Intramuscular Injection Instructions Using a Syringe and Vial, Adult An intramuscular (IM) injection is a shot of medicine that is given into a muscle. An IM injection may be given when medicine needs to start working right away, or when medicine cannot be given any other way. You will use a single-use syringe to give the IM injection. The medicine comes in a bottle (vial). You will fill the syringe with medicine from the  vial. Before your first injection, your health care provider will show you or your caregiver how to do the injection at home. Use only the syringe, needle, and medicine that your health care provider prescribes. Use each syringe and needle only one time. What are the risks? With proper training, this is a safe procedure. However, problems may occur. Common problems include pain and bruising at the injection site. Other problems may include:  Bleeding.  Nerve injury.  Infection.  The medicine going into the blood stream (intravenous administration).  Muscle injury from the injections.  Allergic reaction to the medicine. Supplies needed:  Medication guide or package insert that came with the medicine prescribed by your health care provider. ? Follow directions from the guide about how to prepare and give the injection. This is important because the directions may be different for each medicine.  Medicine prescribed by your health care provider.  Syringe.  Needle. Use the needle length and size (gauge) that your health care provider or pharmacist gives to you. Do not reuse needles. Use each needle only once.  Alcohol wipes.  Bandage.  A container for syringe disposal. ? This may be a puncture-proof sharps container or a hard plastic container that has a secure lid, such as an empty laundry detergent bottle. How to choose a site for injection Follow instructions from your health care provider about where to give the injection. Generally:  The hip is the  preferred injection site.  The injection may also be given in the upper arm or outer thigh, depending on the amount of medicine in the injection and your muscle mass. Do not inject the medicine in the same spot each time. Do not inject over areas of skin that are open, infected, or bruised. How to locate an injection site Refer to the following instructions to find the exact location of each injection site. Outer  thigh  1. Imagine that the thigh is divided into three equal sections (thirds) between the knee and the hip. 2. The injection site area is in the middle third, on the outer side of the thigh. Upper arm  1. Find the bony point at the top of the arm (acromion process). Place two fingers just under the acromion process. 2. Under the two fingers, picture an upside-down triangle over the arm muscle. The injection site is in the middle of the triangle. Hip  1. Place the palm of your hand on the outside of the hip so your wrist is at the top of the upper leg. 2. Your thumb should point toward the groin. 3. Use your ring finger and little finger to feel the upper edge of the pelvic bone. 4. Spread your index finger and middle finger apart from each other into a "V" shape. 5. The injection site is the area between those fingers. Preparing to give an IM injection 1. Check the medicine and syringe for any damage. If you notice any of the following, do not give the medicine and contact your pharmacy or health care provider. ? Cracked glass vial. ? Cloudy medicine. ? Clumps or solids floating in the medicine. 2. Get in a comfortable position so that the muscle site is relaxed. This depends on the site of the injection. How to give an IM injection using medicine from a vial 1. Wash your hands with soap and water. If soap and water are not available, use hand sanitizer. 2. Use an alcohol wipe to clean the site where you will be injecting the needle. Let the site air-dry. 3. Gently roll the medicine vial between your hands to mix it. Do not shake the vial. 4. If there is a plastic covering on the vial, remove it. Clean the top rubber part of the vial with an alcohol wipe. 5. Remove the plastic cover from the needle on the syringe. Do not let the needle touch anything. 6. Pull the plunger back to draw air into the syringe. Stop the plunger when the dose indicator gets to the line that is the same as your  medicine dose. 7. Push the needle through the rubber on the top of the vial. Do not turn the vial over. 8. Push the plunger in all the way. This pushes air into the vial. 9. While the needle is still in the vial, turn the vial upside down and hold it at eye level. 10. Pull back slowly on the plunger to draw medicine into the syringe. Stop when the dose indicator is at the correct amount of medicine needed. 11. Remove the needle from the vial. Do not let the needle touch anything. 12. Hold the syringe with the needle pointing up. Check the syringe for any remaining air bubbles. If there are air bubbles, flick the syringe with your finger until the air bubbles rise to the top. Then, gently push on the plunger until you can see a drop of medicine appear at the tip of the needle. This will clear  any remaining air bubbles from the syringe. 13. Hold the syringe in your writing hand like a pencil. 14. Insert the entire needle straight into the injection site. The needle should be at a 90-degree angle (perpendicular) to the skin. Push the needle all the way through the skin and into the muscle. Continue to hold the syringe with your writing hand. ? If instructed, use your other hand to pull back slightly on the plunger (aspirate) to see if any blood enters the syringe. If there is blood, do not inject the medicine. Remove the needle and start over with a new needle and syringe. Most medicines will not require that you do this step. 15. Use the thumb of your writing hand to push the plunger until the syringe is empty. If the medicine is a gel, you may need to push harder on the plunger to press the medicine out of the syringe and into the muscle. 16. Pull the needle straight out of the skin. 17. Press and hold the alcohol wipe over the injection site until bleeding stops. Do not rub the area. 18. Cover the injection site with a bandage, if needed. 53. Write down the date, time, and location of each injection  that you give. This is important. 20. If more than one injection is needed, give an injection at least 1 inch (2.5 cm) away from the previous injection site. How to safely throw away the supplies If you are using a syringe that does not have a safety system for shielding the needle after injection:  Do not recap the needle. Place the syringe and needle in the disposal container. If your syringe has a safety system for shielding the needle after injection:  Firmly push down on the plunger after you complete the injection. The protective sleeve will automatically cover the needle, and you will hear a click. The click means that the needle is safely covered. Follow the disposal regulations for the area where you live. Do not use any syringe or needle more than one time. You may throw the empty medicine vial in the trash. Contact a health care provider if:  You have difficulty giving the injection.  You think that the injection was not given correctly.  You have difficulty with any of the supplies.  The medicine causes side effects.  Rashes develop on the skin.  A fever develops.  The condition that is being treated gets worse. Get help right away if:  Any of these symptoms develop after the injection is given: ? Difficulty breathing. ? Chest pain. ? A rash over most or all of the body. ? Swelling of the lips or tongue. ? Difficulty swallowing. Summary  An intramuscular (IM) injection is a shot of medicine that is given into a muscle.  Use only the syringe, needle, and medicine that your health care provider prescribes. Use each syringe and needle only one time.  Follow instructions from your health care provider about where to give an injection. This information is not intended to replace advice given to you by your health care provider. Make sure you discuss any questions you have with your health care provider. Document Released: 11/25/2010 Document Revised: 12/15/2017  Document Reviewed: 12/15/2017 Elsevier Interactive Patient Education  2019 [employer].   Venous Thromboembolism Prevention Venous thromboembolism (VTE) is a condition in which a blood clot (thrombus) develops in the body. A thrombus usually occurs in a deep vein in the leg or the pelvis (DVT), but it can also occur in  the arm. Sometimes, pieces of a thrombus can break off from its original place of development and travel through the bloodstream to other parts of the body. When that happens, the thrombus is called an embolus. An embolus that travels to one or both lungs is called a pulmonary embolism. An embolism can block the blood flow in the blood vessels of other organs as well. VTE is a serious health condition that can cause disability or death. It is very important to get help right away and to not ignore symptoms. How can a VTE be prevented?   Exercise regularly. Take a brisk 30 minute walk every day. Staying active and moving around can help you to prevent blood clots.  Avoid sitting or lying in bed for long periods of time without moving your legs. Change your position often, especially during long-distance travel (over 4 hours).  If you are a woman who is over 53 years of age, avoid unnecessary use of medicines that contain estrogen. These include birth control pills and hormone replacement therapy.  Do not smoke, especially if you take estrogen medicines. If you need help quitting, ask your health care provider.  Eat plenty of fruits and vegetables. Ask your health care provider or dietitian if there are foods that you should avoid.  Maintain a weight that is appropriate for your height. Ask your health care provider what weight is healthy for you.  Wear loose-fitting clothing. Avoid constrictive or tight clothing around your legs or waist.  Try not to bump or injure your legs. Avoid crossing your legs when you are sitting.  Do not use pillows under your knees while lying down  unless told by your health care provider.  Wear support hose (compression stockings or TED hose) as told by your health care provider. Compression stockings increase blood flow in your legs and can help prevent blood clots. Do not let them bunch up when you are wearing them. How can I prevent VTE when I travel? Long-distance travel (over 4 hours) can increase the risk of a VTE. To prevent VTE when traveling:  Exercise your legs every hour by standing, stretching, and bending and straightening your legs. If you are traveling by airplane, train, or bus, walk up and down the aisle as often as possible to get your blood moving. If you are traveling by car, stop and get out of the car every hour to exercise your legs and stretch. Other types of exercise might include: ? Keeping your feet flat on the ground and raising your toes. ? Switching from tightening the muscles in your calves and thighs to relaxing those same muscles while you are sitting. ? Pointing and flexing your feet at the ankle joints while you are sitting.  Stay well hydrated while traveling. Drink enough water to keep your urine clear or pale yellow.  Avoid drinking alcohol during long travel. Generally, it is not recommended that you take medicines to prevent DVT during routine travel. How can VTE be prevented if I am hospitalized? A VTE may be prevented by taking medicines that are prescribed to prevent blood clots (anticoagulants). You can also help to prevent VTE while in the hospital by taking these actions:  Get out of bed and walk. Ask your health care provider if this is safe for you to do.  Request the use of a sequential compression device (SCD). This is a machine that pumps air into compression sleeves that are wrapped around your legs.  Request the use of  compression stockings, which are tight, elastic stockings that apply pressure to the lower legs. Compression stockings are sometimes used with SCDs. How can I prevent  VTE after surgery? Understand that there is an increased risk for VTE for the first 4-6 weeks after surgery. During this time:  Avoid long-distance travel (over 4 hours). If you must travel during this time, ask your health care provider about additional preventive actions that you can take. These might include exercising your arms and legs every hour while you travel.  Avoid sitting or lying still for too long. If possible, get up and walk around one time every hour. Ask your health care provider when this is safe for you to do. Get help right away if:  You have new or increased pain, swelling, or redness in an arm or leg.  You have numbness or tingling in an arm or leg.  You have shortness of breath while active or at rest.  You have chest pain.  You have a rapid or irregular heartbeat.  You feel light-headed or dizzy.  You cough up blood.  You notice blood in your vomit, bowel movement, or urine. These symptoms may represent a serious problem that is an emergency. Do not wait to see if the symptoms will go away. Get medical help right away. Call your local emergency services (911 in the [place] Do not drive yourself to the hospital. This information is not intended to replace advice given to you by your health care provider. Make sure you discuss any questions you have with your health care provider. Document Released: 07/29/2009 Document Revised: 03/26/2017 Document Reviewed: 12/05/2014 Elsevier Interactive Patient Education  2019 [employer].

## 2018-08-21 DIAGNOSIS — Z7952 Long term (current) use of systemic steroids: Secondary | ICD-10-CM | POA: Insufficient documentation

## 2018-08-21 DIAGNOSIS — D171 Benign lipomatous neoplasm of skin and subcutaneous tissue of trunk: Secondary | ICD-10-CM | POA: Insufficient documentation

## 2018-08-22 ENCOUNTER — Ambulatory Visit (INDEPENDENT_AMBULATORY_CARE_PROVIDER_SITE_OTHER): Payer: Managed Care, Other (non HMO) | Admitting: Family Medicine

## 2018-08-22 DIAGNOSIS — Z789 Other specified health status: Secondary | ICD-10-CM

## 2018-08-22 DIAGNOSIS — F640 Transsexualism: Secondary | ICD-10-CM

## 2018-08-22 NOTE — Patient Instructions (Signed)
Pt present in the office today for first estradiol injection.   Injection was given in the right thigh muscle with no complications.   [place]: [id number] Lot: [id number] Exp: 11/11/2019

## 2018-08-23 ENCOUNTER — Encounter: Payer: Self-pay | Admitting: Family Medicine

## 2018-09-14 ENCOUNTER — Telehealth: Payer: Self-pay | Admitting: Family Medicine

## 2018-09-14 NOTE — Telephone Encounter (Signed)
Patient is needing ADA forms completed for her job in order to get a sit/stand desk. I do not see anything in the last OV notes that states we would do this but I will place the blank forms in Dr. Nolon Rod box to be completed.  Please return the forms to the [facility]DISABILITY box at the check out desk within 5-7 business days. Thank you!

## 2018-09-15 DIAGNOSIS — Z0271 Encounter for disability determination: Secondary | ICD-10-CM

## 2018-09-21 NOTE — Telephone Encounter (Signed)
Today is the 5th business day have we completed these forms?  If completed please make sure they are returned to me for processing.  Thank you

## 2018-09-27 NOTE — Telephone Encounter (Signed)
Have we complete these forms?

## 2018-09-28 NOTE — Telephone Encounter (Signed)
Caitlyn can you reprint form and I'll have stallings complete and return it to you myself.

## 2018-10-06 ENCOUNTER — Other Ambulatory Visit: Payer: Self-pay

## 2018-10-06 ENCOUNTER — Encounter: Payer: Self-pay | Admitting: Emergency Medicine

## 2018-10-06 ENCOUNTER — Ambulatory Visit (INDEPENDENT_AMBULATORY_CARE_PROVIDER_SITE_OTHER): Payer: Managed Care, Other (non HMO) | Admitting: Emergency Medicine

## 2018-10-06 VITALS — BP 101/58 | HR 88 | Temp 98.4°F | Resp 16 | Ht 66.25 in | Wt 196.6 lb

## 2018-10-06 DIAGNOSIS — Z789 Other specified health status: Secondary | ICD-10-CM

## 2018-10-06 DIAGNOSIS — F640 Transsexualism: Secondary | ICD-10-CM | POA: Diagnosis not present

## 2018-10-06 NOTE — Progress Notes (Signed)
Elizabeth Lane 28 y.o.   Chief Complaint  Patient presents with  . medication review    per patient new medication Provera    HISTORY OF PRESENT ILLNESS: This is a 28 y.o. adult female to female transgender, patient of Dr. Brigitte Pulse, here for blood work.  HPI   Prior to Admission medications   Medication Sig Start Date End Date Taking? Authorizing Provider  dutasteride (AVODART) 0.5 MG capsule Take 1 capsule (0.5 mg total) by mouth daily. 02/10/18  Yes Shawnee Knapp, MD  estradiol valerate (DELESTROGEN) 20 MG/ML injection Inject 0.5 mLs (10 mg total) into the muscle once a week. 08/18/18  Yes Shawnee Knapp, MD  medroxyPROGESTERone (PROVERA) 5 MG tablet Take 1 tablet (5 mg total) by mouth daily. 05/06/18  Yes Stallings, Zoe A, MD  spironolactone (ALDACTONE) 100 MG tablet TAKE 1 TABLET(100 MG) BY MOUTH TWICE DAILY 08/08/18  Yes Shawnee Knapp, MD  NEEDLE, DISP, 18 G 18G X 1-1/2" MISC 1 Units by Does not apply route as needed. 08/18/18   Shawnee Knapp, MD  NEEDLE, DISP, 23 G 23G X 3/4" MISC 1 Units by Does not apply route as needed. 08/18/18   Shawnee Knapp, MD  Syringe, Disposable, 1 ML MISC 1 Units by Does not apply route as needed. 08/18/18   Shawnee Knapp, MD    Allergies  Allergen Reactions  . Lactose Intolerance (Gi)     GI issues    Patient Active Problem List   Diagnosis Date Noted  . Lipoma of anterior chest wall 08/21/2018  . Long term (current) use of systemic steroids 08/21/2018  . Female-to-female transgender person 11/05/2017    No past medical history on file.  No past surgical history on file.  Social History   Socioeconomic History  . Marital status: Single    Spouse name: Not on file  . Number of children: Not on file  . Years of education: Not on file  . Highest education level: Not on file  Occupational History  . Not on file  Social Needs  . Financial resource strain: Not on file  . Food insecurity:    Worry: Not on file    Inability: Not on file  .  Transportation needs:    Medical: Not on file    Non-medical: Not on file  Tobacco Use  . Smoking status: Never Smoker  . Smokeless tobacco: Never Used  Substance and Sexual Activity  . Alcohol use: Yes    Frequency: Never    Comment: 1 a month/ socailly  . Drug use: No  . Sexual activity: Yes  Lifestyle  . Physical activity:    Days per week: Not on file    Minutes per session: Not on file  . Stress: Not on file  Relationships  . Social connections:    Talks on phone: Not on file    Gets together: Not on file    Attends religious service: Not on file    Active member of club or organization: Not on file    Attends meetings of clubs or organizations: Not on file    Relationship status: Not on file  . Intimate partner violence:    Fear of current or ex partner: Not on file    Emotionally abused: Not on file    Physically abused: Not on file    Forced sexual activity: Not on file  Other Topics Concern  . Not on file  Social History Narrative  .  Not on file    Family History  Problem Relation Age of Onset  . Cancer Mother      Review of Systems  Constitutional: Negative.  Negative for chills and fever.  Respiratory: Negative for shortness of breath.   Cardiovascular: Negative for chest pain.  Gastrointestinal: Negative for nausea and vomiting.   Vitals:   10/06/18 1527  BP: (!) 101/58  Pulse: 88  Resp: 16  Temp: 98.4 F (36.9 C)  SpO2: 95%     Physical Exam Vitals signs reviewed.  Cardiovascular:     Rate and Rhythm: Normal rate.  Pulmonary:     Effort: Pulmonary effort is normal.  Neurological:     Mental Status: She is alert.  Psychiatric:        Mood and Affect: Mood normal.        Thought Content: Thought content normal.      ASSESSMENT & PLAN: Elizabeth Lane was seen today for medication review.  Diagnoses and all orders for this visit:  Female-to-female transgender person -     Estradiol -     TestT+TestF+SHBG      Agustina Caroli,  MD [facility]

## 2018-10-06 NOTE — Patient Instructions (Signed)
° ° ° °  If you have lab work done today you will be contacted with your lab results within the next 2 weeks.  If you have not heard from us then please contact us. The fastest way to get your results is to register for My Chart. ° ° °IF you received an x-ray today, you will receive an invoice from [facility]. Please contact [facility] at [phone] with questions or concerns regarding your invoice.  ° °IF you received labwork today, you will receive an invoice from [facility]. Please contact [facility] at [phone] with questions or concerns regarding your invoice.  ° °Our billing staff will not be able to assist you with questions regarding bills from these companies. ° °You will be contacted with the lab results as soon as they are available. The fastest way to get your results is to activate your My Chart account. Instructions are located on the last page of this paperwork. If you have not heard from us regarding the results in 2 weeks, please contact this office. °  ° ° ° °

## 2018-10-07 NOTE — Telephone Encounter (Signed)
I never got these forms back were they completed?

## 2018-10-10 LAB — TESTT+TESTF+SHBG
Sex Hormone Binding: 79.8 nmol/L (ref 24.6–122.0)
Testosterone, Free: 2.6 pg/mL (ref 0.0–4.2)
Testosterone, Total, LC/MS: 15 ng/dL (ref 10.0–55.0)

## 2018-10-10 LAB — ESTRADIOL: Estradiol: 388.6 pg/mL

## 2018-10-28 DIAGNOSIS — F649 Gender identity disorder, unspecified: Secondary | ICD-10-CM | POA: Insufficient documentation

## 2018-10-28 DIAGNOSIS — F640 Transsexualism: Secondary | ICD-10-CM | POA: Insufficient documentation

## 2018-10-28 DIAGNOSIS — IMO0001 Reserved for inherently not codable concepts without codable children: Secondary | ICD-10-CM | POA: Insufficient documentation

## 2018-10-30 ENCOUNTER — Other Ambulatory Visit: Payer: Self-pay | Admitting: Family Medicine

## 2018-10-31 NOTE — Telephone Encounter (Signed)
Patient called, left VM to return call to the office about a refill request. See the below message for details. Will route to office.

## 2018-10-31 NOTE — Telephone Encounter (Signed)
Patient called, left VM to return call to the office. If patient calls back, will need to know if patient is staying at [facility] or following Dr. Brigitte Pulse. If staying at [facility], will need a transfer of care appointment with a provider at [facility].

## 2018-11-09 ENCOUNTER — Encounter: Payer: Self-pay | Admitting: Emergency Medicine

## 2020-04-12 ENCOUNTER — Ambulatory Visit (INDEPENDENT_AMBULATORY_CARE_PROVIDER_SITE_OTHER): Payer: BC Managed Care – PPO | Admitting: Podiatry

## 2020-04-12 ENCOUNTER — Other Ambulatory Visit: Payer: Self-pay

## 2020-04-12 DIAGNOSIS — L600 Ingrowing nail: Secondary | ICD-10-CM

## 2020-04-12 DIAGNOSIS — Q6692 Congenital deformity of feet, unspecified, left foot: Secondary | ICD-10-CM | POA: Diagnosis not present

## 2020-04-15 NOTE — Progress Notes (Signed)
  Subjective:  Patient ID: Elizabeth Lane, adult    DOB: May 06, 1991,  MRN: [id number]  Chief Complaint  Patient presents with  . Nail Problem    Bilateral 1st toenails medial borders ingrown. Pt denies drainage but admits they are painful.  . Foot Problem    Left 3rd and 4th digit do not bend.    29 y.o. female presents with the above complaint. History confirmed with patient.  Her main concern today is that she would like a diagnosis for the inability to flex the third toe on the left foot as well as discuss treatment options for recurrent ingrown toenails.  She goes to a pedicurist and they often have to take the corners out, and she also does this at home and becomes reddened and inflamed.  Objective:  Physical Exam: warm, good capillary refill, no trophic changes or ulcerative lesions, normal DP and PT pulses and normal sensory exam. Left Foot: Mild ingrowing medial nail border of the left hallux with pain on palpation to the distal edge, no paronychia.  She is unable to flex the third toe, extension is intact.  No deficits in other digits.  No sensory deficits Right Foot: Mild ingrowing medial nail border of the right hallux with pain on palpation of the distal edge, no paronychia   Assessment:   1. Ingrowing right great toenail   2. 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  -Discussed ingrown toenails and treatment options including letting the nail edge grew out, Epsom salt soaks, partial nail avulsion with or without matricectomy here in the office.  She is a does not really bother her she mainly does want to see what her possible options are.  She will continue to monitor this and if it becomes ingrowing enough that it is painful she will return for partial nail avulsion with or without matricectomy  -She she appears to have a deficit of the long flexor tendon of the  third toe and has minimal strength to be able to flex the toe.  Unclear if this is an acquired injury from her previous high school sports or if it is congenital, she says it has been there as long as she can remember.  Does not particular bother her or cause any pain.  I advised her that we could get an ultrasound or an MRI if it becomes bothersome but for right now we will continue to monitor.  Return if symptoms worsen or fail to improve.

## 2021-10-24 ENCOUNTER — Other Ambulatory Visit: Payer: Self-pay

## 2021-10-24 DIAGNOSIS — F640 Transsexualism: Secondary | ICD-10-CM | POA: Insufficient documentation

## 2021-12-05 ENCOUNTER — Other Ambulatory Visit: Payer: Self-pay

## 2021-12-22 ENCOUNTER — Other Ambulatory Visit: Payer: Self-pay

## 2022-03-12 ENCOUNTER — Encounter (INDEPENDENT_AMBULATORY_CARE_PROVIDER_SITE_OTHER): Payer: Self-pay

## 2022-03-12 ENCOUNTER — Ambulatory Visit (INDEPENDENT_AMBULATORY_CARE_PROVIDER_SITE_OTHER): Payer: BC Managed Care – PPO

## 2022-03-12 NOTE — PSS Phone Screening (Signed)
Pre-Anesthesia Evaluation    Pre-op phone visit requested by:   Reason for pre-op phone visit: Patient anticipating FOREHEAD CONTOURING WITH BROW LIFT, WITHOUT SCALP ADVANCEMENT, FEMINIZING RHINOPLASTY,FEMINIZING MANDIBLE CONTOURING, LOCAL FLAP, RHINOPLASTY, LEVEL 2 (MEDICAL), ORIF, MANDIBLE procedure.         No orders of the defined types were placed in this encounter.      History of Present Illness/Summary:        Problem List:  Medical Problems       Non-Hospital Problem List  Never Reviewed            ICD-10-CM Priority Class Noted    Gender dysphoria in adult F64.0   10/24/2021    Overview Signed 10/24/2021  8:29 AM by Georges Mouse     Added automatically from request for surgery [id number]             No past medical history on file.  Past Surgical History:   Procedure Laterality Date    bottom surgery      08/2021        Medication List            Accurate as of March 12, 2022 10:13 AM. Always use your most recent med list.                ESTROGENS CONJUGATED IJ  Inject as directed sundays  Medication Adjustments for Surgery: Take as prescribed     PROGESTERONE IM  Inject into the muscle Every 3 months next dose 03/13/22  Medication Adjustments for Surgery: Take as prescribed            No Known Allergies  History reviewed. No pertinent family history.  Social History     Occupational History    Not on file   Tobacco Use    Smoking status: Never    Smokeless tobacco: Never   Vaping Use    Vaping Use: Never used   Substance and Sexual Activity    Alcohol use: Yes     Comment: once a month    Drug use: Never    Sexual activity: Not on file       Menstrual History:   LMP / Status  Other (see note)     No LMP recorded. (Menstrual status: Other (see note)).    Tubal Ligation?  No valid surgical or medical questions entered.             Exam Scores:   SDB score Risk Category: No Risk    PONV score Nausea Risk: VERY SEVERE RISK    MST score MST Score: 0    Allergy score Risk Category: Low Risk    Frailty score          Visit Vitals  Ht 1.702 m (5\' 7" )   Wt 88.5 kg (195 lb)   BMI 30.54 kg/m

## 2022-03-22 NOTE — H&P (Signed)
Assessments        1. Gender dysphoria in adult - F64.0 (Primary)     2. Hormone replacement therapy (HRT) - Z79.890     Tonya Sherman is a 31 year old transgender female with gender dysphoria on hormone replacement therapy. She attributes a portion of her gender dysphoria with bothered by her overall appearance and especially focuses on her browbone, nose, and jaw. She is s/p GRS on 08/2021 with Dr Corky Mull in [place]. She reports no additional surgeries, hospitalizations, intubations, or medication changes since LOV. She presents in no apparent distress or discomfort.     I have reviewed her history and physical with no interval updates.     Examination of the patient revealed evidence of bilateral temporal recession but good hair density. Brows at level of orbital rims, prominent frontal bar. Slight dorsal hump, good NLF rotation. Flat anterior cheeks, wide jaw, and a blunted chin were all observed.      I had a lengthy conversation with Tonya Sherman on her dysphoria and its relationship to her anatomy. As a result of our conversation, we decided upon the following procedures: 1) Feminizing forehead contouring without scap advancement, 2) Brow lift, 3) Feminizing endonasal rhinoplasty, 4) Feminizing mandible contouring.     It was believed that these recommendations would effectively and efficiently reduce her dysphoria. I had an additional discussion with the patient regarding the benefits, alternatives, and risks of the planned facial feminization surgery including but not limited to bleeding, scarring, infection, numbness, time for complete healing, early versus late appearance, facial features and asymmetries not expected to improve, failure to correct the problem and the potential need for further revision surgery. As she will be staying overnight in the hospital, an educational wound care video will be filmed the morning after surgery. The patient understood the consultation as I have given to her and is enthusiastic  to move forward with surgical scheduling as scheduled.      Follow Up   in OR     History of Present Illness   General Notes:   ID: [name] is a 31 year old transgender female with gender dysphora on hormone replacement therapy. She attributes a portion of her gender dysphoria with bothered by her overall appearance and especially focuses on her browbone, nose, and jaw. She was also scheduled for GRS in 08/2021 with Dr Corky Mull in [place]. She was last seen 06/26/2021 via telehealth for her initial consultation where she was recommended: 1) Forehead contouring including brow bone reduction without scalp advancement (coronal approach), 2) Brow lift, 3) Feminizing rhinoplasty (endonasal approach), and 4) Mandible contouring. She is scheduled for FFS on 03/23/2022.   Interval Hx: She presents for her preoperative assessment. She is s/p bottom surgery in January which was completed without complication; otherwise, she reports no additional surgeries, hospitalizations, intubations, or medication changes since LOV. She presents in no apparent distress or discomfort.     Examination   General Examination::  GENERAL APPEARANCE: pleasant, well nourished, well developed, in no acute distress.   COMMUNICATION: oral communication, normal quality of voice.   HEAD: normocephalic, atraumatic.   FACE: Normal strength and symmetry. evidence of bilateral temporal recession but good hair density. Brows at level of orbital rims, prominent frontal bar. Slight dorsal hump, good NLF rotation. Flat anterior cheeks, wide jaw, blunted chin.   EYES: extraocular movement intact (EOMI).   EARS: pinnae and lobes normal.   HEARING: clinically adequate.   RESPIRATORY: chest expansion normal and symmetric.   CARDIOVASCULAR: No swelling  or tenderness of extremities.   NEUROLOGIC: cranial nerves 2-12 grossly intact.   SKIN: normal, no suspicious lesions.

## 2022-03-22 NOTE — Discharge Instr - AVS First Page (Signed)
Reason for your Hospital Admission:  Post-op wound care and pain management      Instructions for after your discharge:  Facial Feminization Post-op Wound Care Instructions:     A post-operative wound care video was completed on the patient's cell phone to explain how to take care of each one of the wounds. There maybe multiple facial incisions including within the scalp and hair region, lip, intra-nasal and also within the mouth depending on the surgery that the patient has  completed.     Starting 1 day after discharge, the patient should complete a daily cleaning routine for all of her external incisions. Except for when the patient is completing the cleaning routine, the patient should have both ace-wraps in place 24 hours a day, 7 days a week. The wraps will need to come off in order to complete the cleaning routine.     Supplies Needs:   1) Hydrogen peroxide  2) Water  3) 4x4 gauze  4) Telfa non-adhesive gauze  5) gentle shampoo cleaners (ex: johnson and johnson baby shampoo)  6) Antibiotic ointment (muporicin has been previously prescribed)  7) Two 4 inch ace-wraps    To complete the cleaning routine first take off the ace-wraps and telfa gauze along the forehead incision. If the patient had a rhinoplasty, then the nasal cast should remain in place and not get wet. Next, make a mixture of 50:50 hydrogen peroxide with water. Use a 4x4 gauze to soak into the diluted hydrogen peroxide mixture and gently place along the forehead incision including into the incision which has staples on it within the hair. Also place the mixture onto the incision on the lip region. DO NOT place this mixture on the upper eyelids, within the nose or within the mouth. All the mixture to bubble and then you can soak a different gauze in water and gently dissolve and clean the incisions of any crusting.     Next, the patient should have her hair washed with the gentle shampoo. It is important to keep the hair clean and not matted to  the staple or incision line as this can make it very difficult to remove the staples at her post-operative appointment. While washing the hair, make sure to NOT get the nasal cast wet as this will cause the cast to fall off early. The patient may shower the rest of her body but it is recommended that she clean her face with a wash cloth in order to avoid issues with the nasal cast.     Once the hair and face have been cleaned and washed, the hair can be dried with a hair dryer or towel. Careful attention should be paid to the scalp region with the hair dryer since the scalp will have decreased sensation. A burn is possible given the decreased sensation. Antibiotic ointment (mupirocin) should then be placed on the entire hairline and forehead incision in addition to the lip incision.     Next, telfa non-adhesive gauze should then be placed onto the non-hair bearing portion of the forehead incision to avoid the ace-wraps from sticking to the incision. Next, the ace-wraps should then be replaced first with the "Vertical" ace-wraps which is the wrap that goes from the chin/jaw region to the top of the head followed by the "Horizontal" ace-wrap which goes across the forehead/frontal bone region and to the back of the head. The ace-wraps should be providing gentle compression and should not be strangulating in any way.       Finally, the patient should continue her normal oral care including brushing her teeth, mouth rinse and flossing. She has been prescribed Peridex oral rinse which should be used following meals to help keep her intra-oral incisions clean. The patient has no restriciton in regards to the type of diet she can consume. We recommend staying away from acidic foods (Ex: orange juice) or foods that have sharp edges (ex: Chips or pretzels) as this can be painful while consuming with the intra-oral incisions.     The patient's sutures and staples will be removed by Dr. Garland Hincapie at her 1 week post-operative  appointment.

## 2022-03-23 ENCOUNTER — Ambulatory Visit: Payer: BC Managed Care – PPO | Admitting: Anesthesiology

## 2022-03-23 ENCOUNTER — Ambulatory Visit
Admission: RE | Admit: 2022-03-23 | Discharge: 2022-03-24 | Disposition: A | Discharge status: Home / Self Care | Payer: BC Managed Care – PPO | Source: Ambulatory Visit | Attending: Otolaryngology | Admitting: Otolaryngology

## 2022-03-23 ENCOUNTER — Encounter (HOSPITAL_BASED_OUTPATIENT_CLINIC_OR_DEPARTMENT_OTHER)
Admission: RE | Disposition: A | Discharge status: Home / Self Care | Payer: Self-pay | Source: Ambulatory Visit | Attending: Otolaryngology

## 2022-03-23 ENCOUNTER — Encounter: Payer: Self-pay | Admitting: Otolaryngology

## 2022-03-23 DIAGNOSIS — F640 Transsexualism: Secondary | ICD-10-CM | POA: Insufficient documentation

## 2022-03-23 DIAGNOSIS — Z79890 Hormone replacement therapy (postmenopausal): Secondary | ICD-10-CM | POA: Insufficient documentation

## 2022-03-23 HISTORY — PX: FACELIFT, (RHYTIDECTOMY) (MEDICAL): SHX4094

## 2022-03-23 SURGERY — FACELIFT, (RHYTIDECTOMY) (MEDICAL)
Anesthesia: Anesthesia General | Site: Nose | Wound class: Clean Contaminated

## 2022-03-23 MED ORDER — LIDOCAINE HCL (PF) 2 % IJ SOLN
INTRAMUSCULAR | Status: AC
Start: 2022-03-23 — End: ?
  Filled 2022-03-23: qty 5

## 2022-03-23 MED ORDER — SUGAMMADEX SODIUM 200 MG/2ML IV SOLN
INTRAVENOUS | Status: DC | PRN
Start: 2022-03-23 — End: 2022-03-23
  Administered 2022-03-23: 200 mg via INTRAVENOUS

## 2022-03-23 MED ORDER — FENTANYL CITRATE (PF) 50 MCG/ML IJ SOLN (WRAP)
INTRAMUSCULAR | Status: AC
Start: 2022-03-23 — End: ?
  Filled 2022-03-23: qty 2

## 2022-03-23 MED ORDER — SODIUM CHLORIDE 0.9% BAG (IRRIGATION USE)
INTRAVENOUS | Status: DC | PRN
Start: 2022-03-23 — End: 2022-03-23

## 2022-03-23 MED ORDER — MIDAZOLAM HCL 1 MG/ML IJ SOLN (WRAP)
INTRAMUSCULAR | Status: DC | PRN
Start: 2022-03-23 — End: 2022-03-23
  Administered 2022-03-23: 2 mg via INTRAVENOUS

## 2022-03-23 MED ORDER — ONDANSETRON 4 MG PO TBDP
4.0000 mg | ORAL_TABLET | Freq: Three times a day (TID) | ORAL | Status: DC | PRN
Start: 2022-03-23 — End: 2022-03-24

## 2022-03-23 MED ORDER — NALOXONE HCL 0.4 MG/ML IJ SOLN (WRAP)
0.2000 mg | INTRAMUSCULAR | Status: DC | PRN
Start: 2022-03-23 — End: 2022-03-24

## 2022-03-23 MED ORDER — PROPOFOL 10 MG/ML IV EMUL (WRAP)
INTRAVENOUS | Status: AC
Start: 2022-03-23 — End: ?
  Filled 2022-03-23: qty 100

## 2022-03-23 MED ORDER — PROMETHAZINE HCL 12.5 MG RE SUPP
25.0000 mg | Freq: Four times a day (QID) | RECTAL | Status: DC | PRN
Start: 2022-03-23 — End: 2022-03-24

## 2022-03-23 MED ORDER — DIPHENHYDRAMINE HCL 25 MG PO CAPS
25.0000 mg | ORAL_CAPSULE | Freq: Three times a day (TID) | ORAL | Status: DC | PRN
Start: 2022-03-23 — End: 2022-03-24

## 2022-03-23 MED ORDER — LIDOCAINE HCL 1 % IJ SOLN
1.0000 mL | Freq: Once | INTRAMUSCULAR | Status: DC | PRN
Start: 2022-03-23 — End: 2022-03-23

## 2022-03-23 MED ORDER — CEFAZOLIN SODIUM-DEXTROSE 2-3 GM-%(50ML) IV SOLR
INTRAVENOUS | Status: AC
Start: 2022-03-23 — End: ?
  Filled 2022-03-23: qty 50

## 2022-03-23 MED ORDER — HYDROMORPHONE HCL 1 MG/ML IJ SOLN
INTRAMUSCULAR | Status: DC | PRN
Start: 2022-03-23 — End: 2022-03-23
  Administered 2022-03-23 (×2): .5 mg via INTRAVENOUS

## 2022-03-23 MED ORDER — GABAPENTIN 100 MG PO CAPS
200.0000 mg | ORAL_CAPSULE | Freq: Once | ORAL | Status: AC
Start: 2022-03-23 — End: 2022-03-23
  Administered 2022-03-23: 200 mg via ORAL

## 2022-03-23 MED ORDER — HYDROMORPHONE HCL 1 MG/ML IJ SOLN
INTRAMUSCULAR | Status: AC
Start: 2022-03-23 — End: ?
  Filled 2022-03-23: qty 1

## 2022-03-23 MED ORDER — LIDOCAINE-EPINEPHRINE 1 %-1:100000 IJ SOLN
INTRAMUSCULAR | Status: DC | PRN
Start: 2022-03-23 — End: 2022-03-23
  Administered 2022-03-23: 5 mL
  Administered 2022-03-23: 10 mL
  Administered 2022-03-23: 30 mL

## 2022-03-23 MED ORDER — PROPOFOL 10 MG/ML IV EMUL (WRAP)
INTRAVENOUS | Status: AC
Start: 2022-03-23 — End: ?
  Filled 2022-03-23: qty 40

## 2022-03-23 MED ORDER — GABAPENTIN 100 MG PO CAPS
ORAL_CAPSULE | ORAL | Status: AC
Start: 2022-03-23 — End: ?
  Filled 2022-03-23: qty 2

## 2022-03-23 MED ORDER — MIDAZOLAM HCL 1 MG/ML IJ SOLN (WRAP)
INTRAMUSCULAR | Status: AC
Start: 2022-03-23 — End: ?
  Filled 2022-03-23: qty 2

## 2022-03-23 MED ORDER — ONDANSETRON HCL 4 MG/2ML IJ SOLN
4.0000 mg | Freq: Three times a day (TID) | INTRAMUSCULAR | Status: DC | PRN
Start: 2022-03-23 — End: 2022-03-24

## 2022-03-23 MED ORDER — ALBUMIN HUMAN/BIOSIMILIAR 5% IV SOLN (WRAP)
INTRAVENOUS | Status: DC | PRN
Start: 2022-03-23 — End: 2022-03-23

## 2022-03-23 MED ORDER — OXYMETAZOLINE HCL 0.05 % NA SOLN
NASAL | Status: DC | PRN
Start: 2022-03-23 — End: 2022-03-23
  Administered 2022-03-23: 30 mL

## 2022-03-23 MED ORDER — PROPOFOL INFUSION 10 MG/ML
INTRAVENOUS | Status: DC | PRN
Start: 2022-03-23 — End: 2022-03-23
  Administered 2022-03-23: 100 ug/kg/min via INTRAVENOUS

## 2022-03-23 MED ORDER — AMOXICILLIN-POT CLAVULANATE 875-125 MG PO TABS
1.0000 | ORAL_TABLET | Freq: Two times a day (BID) | ORAL | Status: DC
Start: 2022-03-23 — End: 2022-03-24
  Administered 2022-03-23 – 2022-03-24 (×2): 1 via ORAL
  Filled 2022-03-23 (×3): qty 1

## 2022-03-23 MED ORDER — HYDROMORPHONE HCL 0.5 MG/0.5 ML IJ SOLN
0.5000 mg | INTRAMUSCULAR | Status: DC | PRN
Start: 2022-03-23 — End: 2022-03-23

## 2022-03-23 MED ORDER — OXYCODONE HCL 5 MG PO TABS
5.0000 mg | ORAL_TABLET | Freq: Once | ORAL | Status: AC | PRN
Start: 2022-03-23 — End: 2022-03-23

## 2022-03-23 MED ORDER — ACETAMINOPHEN 500 MG PO TABS
1000.0000 mg | ORAL_TABLET | Freq: Once | ORAL | Status: AC
Start: 2022-03-23 — End: 2022-03-23
  Administered 2022-03-23: 1000 mg via ORAL

## 2022-03-23 MED ORDER — LACTATED RINGERS IV SOLN
INTRAVENOUS | Status: DC | PRN
Start: 2022-03-23 — End: 2022-03-23

## 2022-03-23 MED ORDER — STERILE WATER FOR INJECTION IJ/IV SOLN (WRAP)
2.0000 g | Freq: Once | INTRAMUSCULAR | Status: AC
Start: 2022-03-23 — End: 2022-03-23
  Administered 2022-03-23 (×2): 2 g via INTRAVENOUS

## 2022-03-23 MED ORDER — SODIUM CHLORIDE 0.9 % IR SOLN
Status: DC | PRN
Start: 2022-03-23 — End: 2022-03-23
  Administered 2022-03-23: 4000 mL
  Administered 2022-03-23: 1000 mL

## 2022-03-23 MED ORDER — HYDROMORPHONE HCL 0.5 MG/0.5 ML IJ SOLN
0.2500 mg | INTRAMUSCULAR | Status: DC | PRN
Start: 2022-03-23 — End: 2022-03-24
  Administered 2022-03-23 – 2022-03-24 (×3): 0.25 mg via INTRAVENOUS
  Filled 2022-03-23 (×3): qty 1

## 2022-03-23 MED ORDER — MUPIROCIN 2 % EX OINT
TOPICAL_OINTMENT | Freq: Once | CUTANEOUS | Status: DC
Start: 2022-03-23 — End: 2022-03-24
  Filled 2022-03-23: qty 1

## 2022-03-23 MED ORDER — ROCURONIUM BROMIDE 50 MG/5ML IV SOLN
INTRAVENOUS | Status: AC
Start: 2022-03-23 — End: ?
  Filled 2022-03-23: qty 5

## 2022-03-23 MED ORDER — METOCLOPRAMIDE HCL 5 MG/ML IJ SOLN
10.0000 mg | Freq: Once | INTRAMUSCULAR | Status: DC | PRN
Start: 2022-03-23 — End: 2022-03-23

## 2022-03-23 MED ORDER — ACETAMINOPHEN 325 MG PO TABS
650.0000 mg | ORAL_TABLET | ORAL | Status: DC | PRN
Start: 2022-03-23 — End: 2022-03-24

## 2022-03-23 MED ORDER — SODIUM CHLORIDE 0.9 % IV SOLN
6.2500 mg | Freq: Four times a day (QID) | INTRAVENOUS | Status: DC | PRN
Start: 2022-03-23 — End: 2022-03-24

## 2022-03-23 MED ORDER — FAMOTIDINE 10 MG/ML IV SOLN (WRAP)
INTRAVENOUS | Status: AC
Start: 2022-03-23 — End: ?
  Filled 2022-03-23: qty 2

## 2022-03-23 MED ORDER — CHLORHEXIDINE GLUCONATE 0.12 % MT SOLN
15.0000 mL | Freq: Two times a day (BID) | OROMUCOSAL | Status: DC
Start: 2022-03-23 — End: 2022-03-24
  Administered 2022-03-23 – 2022-03-24 (×2): 15 mL via OROMUCOSAL
  Filled 2022-03-23 (×4): qty 15

## 2022-03-23 MED ORDER — PHENYLEPHRINE 100 MCG/ML IV BOLUS (ANESTHESIA)
PREFILLED_SYRINGE | INTRAVENOUS | Status: DC | PRN
Start: 2022-03-23 — End: 2022-03-23
  Administered 2022-03-23 (×4): 100 ug via INTRAVENOUS

## 2022-03-23 MED ORDER — SODIUM CHLORIDE (PF) 0.9 % IJ SOLN
3.0000 mL | Freq: Three times a day (TID) | INTRAMUSCULAR | Status: DC
Start: 2022-03-23 — End: 2022-03-24
  Administered 2022-03-23 – 2022-03-24 (×3): 3 mL via INTRAVENOUS

## 2022-03-23 MED ORDER — DEXAMETHASONE SODIUM PHOSPHATE 20 MG/5ML IJ SOLN
INTRAMUSCULAR | Status: AC
Start: 2022-03-23 — End: ?
  Filled 2022-03-23: qty 5

## 2022-03-23 MED ORDER — SIMETHICONE 80 MG PO CHEW
80.0000 mg | CHEWABLE_TABLET | Freq: Four times a day (QID) | ORAL | Status: DC | PRN
Start: 2022-03-23 — End: 2022-03-23

## 2022-03-23 MED ORDER — ACETAMINOPHEN 500 MG PO TABS
ORAL_TABLET | ORAL | Status: AC
Start: 2022-03-23 — End: ?
  Filled 2022-03-23: qty 2

## 2022-03-23 MED ORDER — MUPIROCIN 2 % EX OINT
TOPICAL_OINTMENT | CUTANEOUS | Status: DC | PRN
Start: 2022-03-23 — End: 2022-03-23
  Administered 2022-03-23: 1 via TOPICAL

## 2022-03-23 MED ORDER — BSS IO SOLN
INTRAOCULAR | Status: DC | PRN
Start: 2022-03-23 — End: 2022-03-23
  Administered 2022-03-23: 10 mL

## 2022-03-23 MED ORDER — DEXAMETHASONE SODIUM PHOSPHATE 4 MG/ML IJ SOLN (WRAP)
INTRAMUSCULAR | Status: DC | PRN
Start: 2022-03-23 — End: 2022-03-23
  Administered 2022-03-23: 8 mg via INTRAVENOUS

## 2022-03-23 MED ORDER — FENTANYL CITRATE (PF) 50 MCG/ML IJ SOLN (WRAP)
25.0000 ug | INTRAMUSCULAR | Status: DC | PRN
Start: 2022-03-23 — End: 2022-03-23

## 2022-03-23 MED ORDER — LIDOCAINE 4 % EX CREA
TOPICAL_CREAM | Freq: Once | CUTANEOUS | Status: DC | PRN
Start: 2022-03-23 — End: 2022-03-23

## 2022-03-23 MED ORDER — MELATONIN 3 MG PO TABS
3.0000 mg | ORAL_TABLET | Freq: Every evening | ORAL | Status: DC | PRN
Start: 2022-03-23 — End: 2022-03-24

## 2022-03-23 MED ORDER — CLINDAMYCIN PHOSPHATE IN D5W 600 MG/50ML IV SOLN
INTRAVENOUS | Status: AC
Start: 2022-03-23 — End: ?
  Filled 2022-03-23: qty 50

## 2022-03-23 MED ORDER — ONDANSETRON HCL 4 MG/2ML IJ SOLN
INTRAMUSCULAR | Status: AC
Start: 2022-03-23 — End: ?
  Filled 2022-03-23: qty 2

## 2022-03-23 MED ORDER — SODIUM CHLORIDE 0.9 % IV SOLN
INTRAVENOUS | Status: DC | PRN
Start: 2022-03-23 — End: 2022-03-23
  Administered 2022-03-23 (×2): 4 ug via INTRAVENOUS

## 2022-03-23 MED ORDER — PROPOFOL 10 MG/ML IV EMUL (WRAP)
INTRAVENOUS | Status: DC | PRN
Start: 2022-03-23 — End: 2022-03-23
  Administered 2022-03-23: 100 mg via INTRAVENOUS
  Administered 2022-03-23: 20 mg via INTRAVENOUS
  Administered 2022-03-23: 200 mg via INTRAVENOUS
  Administered 2022-03-23: 50 mg via INTRAVENOUS

## 2022-03-23 MED ORDER — OXYCODONE HCL 5 MG PO TABS
ORAL_TABLET | ORAL | Status: AC
Start: 2022-03-23 — End: 2022-03-23
  Administered 2022-03-23: 5 mg via ORAL
  Filled 2022-03-23: qty 1

## 2022-03-23 MED ORDER — LIDOCAINE HCL 2 % IJ SOLN
INTRAMUSCULAR | Status: DC | PRN
Start: 2022-03-23 — End: 2022-03-23
  Administered 2022-03-23: 100 mg via INTRAVENOUS

## 2022-03-23 MED ORDER — ROCURONIUM BROMIDE 10 MG/ML IV SOLN (WRAP)
INTRAVENOUS | Status: DC | PRN
Start: 2022-03-23 — End: 2022-03-23
  Administered 2022-03-23: 20 mg via INTRAVENOUS
  Administered 2022-03-23: 50 mg via INTRAVENOUS
  Administered 2022-03-23: 10 mg via INTRAVENOUS

## 2022-03-23 MED ORDER — CEFAZOLIN SODIUM 1 G IJ SOLR
INTRAMUSCULAR | Status: AC
Start: 2022-03-23 — End: ?
  Filled 2022-03-23: qty 2000

## 2022-03-23 MED ORDER — OXYCODONE HCL 5 MG PO TABS
5.0000 mg | ORAL_TABLET | ORAL | Status: DC | PRN
Start: 2022-03-23 — End: 2022-03-24
  Administered 2022-03-24: 5 mg via ORAL
  Filled 2022-03-23: qty 1

## 2022-03-23 MED ORDER — PROPOFOL 10 MG/ML IV EMUL (WRAP)
INTRAVENOUS | Status: AC
Start: 2022-03-23 — End: ?
  Filled 2022-03-23: qty 20

## 2022-03-23 MED ORDER — OXYCODONE HCL 10 MG PO TABS
10.0000 mg | ORAL_TABLET | ORAL | Status: DC | PRN
Start: 2022-03-23 — End: 2022-03-24

## 2022-03-23 MED ORDER — ONDANSETRON HCL 4 MG/2ML IJ SOLN
INTRAMUSCULAR | Status: DC | PRN
Start: 2022-03-23 — End: 2022-03-23
  Administered 2022-03-23: 4 mg via INTRAVENOUS

## 2022-03-23 MED ORDER — FENTANYL CITRATE (PF) 50 MCG/ML IJ SOLN (WRAP)
INTRAMUSCULAR | Status: DC | PRN
Start: 2022-03-23 — End: 2022-03-23
  Administered 2022-03-23: 25 ug via INTRAVENOUS
  Administered 2022-03-23: 50 ug via INTRAVENOUS
  Administered 2022-03-23: 25 ug via INTRAVENOUS
  Administered 2022-03-23: 75 ug via INTRAVENOUS
  Administered 2022-03-23: 25 ug via INTRAVENOUS

## 2022-03-23 MED ORDER — FAMOTIDINE 10 MG/ML IV SOLN (WRAP)
20.0000 mg | Freq: Once | INTRAVENOUS | Status: AC
Start: 2022-03-23 — End: 2022-03-23
  Administered 2022-03-23: 20 mg via INTRAVENOUS

## 2022-03-23 MED ORDER — SCOPOLAMINE 1 MG/3DAYS TD PT72
1.0000 | MEDICATED_PATCH | Freq: Once | TRANSDERMAL | Status: DC
Start: 2022-03-23 — End: 2022-03-23

## 2022-03-23 MED ORDER — LACTATED RINGERS IV SOLN
INTRAVENOUS | Status: DC
Start: 2022-03-23 — End: 2022-03-23

## 2022-03-23 MED ORDER — PROMETHAZINE HCL 25 MG PO TABS
25.0000 mg | ORAL_TABLET | Freq: Four times a day (QID) | ORAL | Status: DC | PRN
Start: 2022-03-23 — End: 2022-03-24

## 2022-03-23 SURGICAL SUPPLY — 231 items
ADHESIVE LIQUID WATERPROOF VIAL PREP NONSTAIN MASTISOL STYRAX GUM (Skin Closure) ×3 IMPLANT
ADHESIVE LQ STYRAX GUM MASTIC ALC MTHY (Skin Closure) ×4
ADHESIVE SKIN CLOSURE DERMABOND ADVANCED (Skin Closure) ×3
ADHESIVE SKIN CLOSURE DERMABOND ADVANCED .7 ML LIQUID APPLICATOR (Skin Closure) ×3 IMPLANT
ADHESIVE SKNCLS 2 OCTYL CYNCRLT .7ML (Skin Closure) ×4
APPLIER IN CLP TI SM PRM SGCLP III 9IN (Clips) ×4
APPLIER INTERNAL CLIP SMALL L9 IN (Clips) ×3
APPLIER INTERNAL CLIP SMALL L9 IN TITANIUM 20 CLIP PREMIUM SURGICLIP (Clips) ×3 IMPLANT
BANDAGE ACE COMPRESSION L5 YD X W4 IN ELASTIC CONTACT CLOSURE OVER (Bandage) ×6 IMPLANT
BANDAGE CMPR CFLX NL 5YDX4IN LF STRL (Procedure Accessories) ×8
BANDAGE CMPR CTTN PLSTR ACE 5YDX4IN LF (Bandage) ×8
BANDAGE COFLEX NL COMPRESSION L5 YD X W4 IN COHESIVE SOFT FOAM TAN (Procedure Accessories) ×6 IMPLANT
BANDAGE COMPRESSION L5 YD X W4 IN (Bandage) ×6
BANDAGE GAUZE L3.6 YD X W3.4 IN 6 PLY ABSORBENT STRETCH TIGHT FINISH (Bandage) ×3 IMPLANT
BANDAGE GZE CTTN BLK2 3.6YDX3.4IN LF (Bandage) ×4
BANDAGE GZE CTTN MED KRLX 3.6YDX6.4IN LF (Dressing) ×8
BANDAGE KERLIX MEDIUM GAUZE L3.6 YD X (Dressing) ×6 IMPLANT
BANDAGE MEDLINE GAUZE L3.6 YD X W3.4 IN (Bandage) ×3
BIT DRILL L67 MM OD2 MM NA WIRE PASS (Drillbits) ×6 IMPLANT
BIT DRILL L67 MM OD2 MM WIRE PASS (Drillbits) ×6
BIT DRL 2MM 67MM STRL WRPS (Drillbits) ×8
BLADE 10 BARD-PARKER RIB-BACK CARBON (Blade) ×6
BLADE 10 BARD-PARKER RIB-BACK CARBON STEEL TISSUE SURGICAL (Blade) ×6 IMPLANT
BLADE 15 CLASSIC CARBON STEEL TISSUE (Blade) ×6
BLADE 15 CLASSIC CARBON STEEL TISSUE SURGICAL (Blade) ×6 IMPLANT
BLADE OPHTHALMIC STAB INCISION 15 D D5 (Blade) ×6
BLADE OPHTHALMIC STAB INCISION 15 D D5 MM BEAVER MICRO-SHARP GREEN (Blade) ×6 IMPLANT
BLADE OPTH 15D 5MM BVR MCSHRP STAB INCS (Blade) ×8
BLADE RASP LRG TEAR CROSS CUT (Blade) ×4
BLADE SRG CBNSTL 10 BP RB-BCK LF STRL (Blade) ×8
BLADE SRG CBNSTL 15 BP RB-BCK LF STRL (Blade) ×8
BULB DRAINAGE LIGHTWEIGHT LOW LEVEL (Drain) ×3
BULB DRAINAGE LIGHTWEIGHT LOW LEVEL SUCTION RELIAVAC SILICONE 100 CC (Drain) ×3 IMPLANT
BULB DRN SIL 100CC LF STRL LTWT LO LVL (Drain) ×4
BURR CARBIDE ROUND OD5 MM L44.5 MM (Burr) ×6
BURR CARBIDE ROUND OD5 MM L44.5 MM SURGICAL NA (Burr) ×6 IMPLANT
BURR DIAMOND OD5 MM L55 MM SURGICAL (Blade) ×3
BURR DIAMOND OD5 MM L55 MM SURGICAL ROUND COARSE BRANDX (Blade) ×3 IMPLANT
BURR DIAMOND ROUND OD4 MM L54.5 MM (Burr) ×3
BURR DIAMOND ROUND OD4 MM L54.5 MM SURGICAL MEDIUM GRIT NA (Burr) ×3 IMPLANT
BURR SRG CRB RND 5MM 44.5MM LF STRL DISP (Burr) ×8
BURR SRG DMD 5MM 55MM STRL DMD RND CRS (Blade) ×4
BURR SRG DMD RND 4MM 54.5MM LF STRL MED (Burr) ×4
CATHETER URETHRAL OD14 FR FOLEY (Catheter Urine) ×3
CATHETER URETHRAL OD14 FR FOLEY TEMPERATURE SENSE SIDE ARM THERMISTOR (Catheter Urine) ×3 IMPLANT
CATHETER URTH SIL LVL 1 14FR 13IN LF (Catheter Urine) ×4
COMB HAIR PLASTIC FIRM FLEXIBLE TEETH (Patient Supply) ×3
COMB HAIR PLASTIC FIRM FLEXIBLE TEETH BLACK ADULT L7 IN MEDLINE (Patient Supply) ×3 IMPLANT
COMB HAIR PLS 7IN LF FIRM FLXB TTH ADLT (Patient Supply) ×4
CORD ELECTROSURGICAL GREEN 12FT PLASTIC BIPOLAR STERILE DISPOSABLE (Cautery) ×3 IMPLANT
CORD ESURG PLS 12FT STRL BP DISP GRN (Cautery) ×4
DECANTER FLD 9IN LTX BG WHT (Procedure Accessories)
DECANTER FLUID L9 IN BAG MEDLINE WHITE (Procedure Accessories)
DECANTER FLUID L9 IN BAG WHITE (Procedure Accessories) IMPLANT
DEVICE CLOSURE L23 IN 3-0 P-14 V-LOC 90 (Suture) ×12
DEVICE CLOSURE L9 IN 3-0 CV-23 V-LOC 90 (Procedure Accessories) ×15
DRAIN PENROSE 12X.25 STERILE (Drain) ×4 IMPLANT
DRAPE 66X44IN SLUSH WARMER PLATE EQUIPMENT ORS HUSH SLUSH STERILE (Drape) ×3 IMPLANT
DRAPE EQP ORS HSLSH 66X44IN STRL SLSH (Drape) ×4
DRAPE SRG CNVRT 44X40IN LF STRL FLTR (Drape) ×4
DRAPE SRG SMS LG PRXM 85X70IN LF STRL (Drape) ×4
DRAPE SRG TBRN CNVRT 120X77IN LF STRL (Drape)
DRAPE SRGCL REINFORCE SPLIT ABSRBNT TUBE HOLDER 120X77IN TIBURON (Drape) IMPLANT
DRAPE SURGICAL FANFOLD SHEET L85 IN X (Drape) ×3
DRAPE SURGICAL FANFOLD SHEET L85 IN X W70 IN MEDLINE SMS LARGE (Drape) ×3 IMPLANT
DRAPE SURGICAL FILTER SCREEN FLUID CONTROL POUCH DRAINAGE PORT L44 IN (Drape) ×3 IMPLANT
DRAPE SURGICAL REINFORCE SPLIT ABSORBENT (Drape)
DRESSING WND PLSTR CTTN TELFA 4X3IN LF (Dressing) IMPLANT
ELECTRODE ADULT PATIENT RETURN L9 FT REM POLYHESIVE ACRYLIC FOAM (Procedure Accessories) ×3 IMPLANT
ELECTRODE ELECTROSRGCL NDL L2.84 IN OD3/32 IN EDGE L.2 IN INSLTE SHFT (Cautery) ×3 IMPLANT
ELECTRODE ELECTROSURGICAL BLADE L70 MM (Blade) ×3
ELECTRODE ELECTROSURGICAL BLADE L70 MM NEPTUNE E-SEP INSULATE COATED (Blade) ×3 IMPLANT
ELECTRODE ELECTROSURGICAL NEEDLE L2.84 (Cautery) ×3
ELECTRODE ESURG BLDE NPTN E-SEP 70MM LF (Blade) ×4
ELECTRODE ESURG NDL EDG 3/32IN 2.84IN LF (Cautery) ×4
ELECTRODE PATIENT RETURN L9 FT VALLEYLAB (Procedure Accessories) ×3
ELECTRODE PT RTN RM PHSV ACRL FM C30- LB (Procedure Accessories) ×4
FORCEPS ELECTROSURGICAL L8 1/4 IN (Procedure Accessories)
FORCEPS ELECTROSURGICAL L8 1/4 IN BAYONET BIPOLAR SMOOTH STRAIGHT TIP (Procedure Accessories) IMPLANT
FORCEPS ESURG BYNT OLSN 8.25IN STRL BP (Procedure Accessories)
GLOVE SRG PLISPRN 7.5 BGL PI INDCTR (Glove) ×4
GLOVE SRG PLISPRN 7.5 BGL PI MIC LF STRL (Glove) ×8
GLOVE SRG PLISPRN 7.5 BGL PI ULTRATOUCH (Glove) ×8
GLOVE SURGICAL 7 1/2 BIOGEL PI (Glove) ×6
GLOVE SURGICAL 7 1/2 BIOGEL PI INDICATOR (Glove) ×3
GLOVE SURGICAL 7 1/2 BIOGEL PI INDICATOR UNDERGLOVE POWDER FREE SMOOTH (Glove) ×3 IMPLANT
GLOVE SURGICAL 7 1/2 BIOGEL PI ULTRATOUCH G POWDER FREE ROUGH BEAD (Glove) ×6 IMPLANT
GLOVE SURGICAL 7.5 BIOGEL PI MICRO (Glove) ×6
GLOVE SURGICAL 7.5 BIOGEL PI MICRO POWDER FREE ROUGH BEAD CUFF (Glove) ×6 IMPLANT
GOWN SRG FBRC LG STD ROYALSILK LF STRL (Gown) ×4
GOWN SRG XL SMARTGOWN LF STRL LVL 4 (Gown) ×8
GOWN SURGICAL LARGE STANDARD FABRIC (Gown) ×3
GOWN SURGICAL LARGE STANDARD FABRIC ROYALSILK LEVEL 3 NONREINFORCE SET (Gown) ×3 IMPLANT
GOWN SURGICAL XL SMARTGOWN LEVEL 4 (Gown) ×6
GOWN SURGICAL XL SMARTGOWN LEVEL 4 BREATHABLE (Gown) ×6 IMPLANT
KIT STAPLE REMOVER L3 IN X W3 IN 4 IN 12 (Procedure Accessories)
KIT STAPLE REMOVER L3 IN X W3 IN 4 IN 12 PLY SPONGE BASIC GAUZE (Procedure Accessories) IMPLANT
KIT STPL REM GZE 4IN 3X3IN LF STRL 12 (Procedure Accessories)
MARKER SKIN L6 IN RULER LARGE RESERVOIR (Other)
MARKER SKIN L6 IN RULER LARGE RESERVOIR FINE TIP 6 LABEL GENTIAN (Other) IMPLANT
MARKER SKN OR NECESSITIES 6IN LF STRL (Other)
MAT ABSORBENT (Procedure Accessories) ×4 IMPLANT
NEEDLE HPO STD 19GA 1.5IN LF REG BVL (Needles) ×4
NEEDLE HPO STD 22GA 1.5IN LF REG BVL (Needles)
NEEDLE HYPODERMIC L1.5 IN OD19 GA (Needles) ×3
NEEDLE HYPODERMIC L1.5 IN OD19 GA STANDARD REGULAR BEVEL (Needles) ×3 IMPLANT
NEEDLE HYPODERMIC L1.5 IN OD22 GA (Needles)
NEEDLE HYPODERMIC L1.5 IN OD22 GA STANDARD REGULAR BEVEL (Needles) IMPLANT
PACK SRG LF STRL JLY DISP (Procedure Accessories) ×4
PACK SURGICAL JELLY (Procedure Accessories) ×3
PEN SRGMRK 6IN LF STRL RLR LG RSRV REG (Positioning Supplies) ×4
PEN SURGICAL MARKING MEDLINE SKIN RULER (Positioning Supplies) ×3
PEN SURGICAL MARKING SKIN RULER LARGE RESERVOIR REGULAR TIP LABEL (Positioning Supplies) ×3 IMPLANT
PENCIL SMKEVC LF COAT PSHBTN (Cautery) ×4
PENCIL SMOKE EVACUATOR COATED PUSH (Cautery) ×3
PENCIL SMOKE EVACUATOR COATED PUSH BUTTON NEPTUNE E-SEP (Cautery) ×3 IMPLANT
PLATE BN TI STD MIC MATRIXMIDFACE .4MM (Plate) ×4 IMPLANT
PLATE H.4 MM STANDARD MICRO ORBITAL RIM (Plate) ×3 IMPLANT
PLATE H.4 MM STANDARD MICRO ORBITAL RIM 12 HOLE TITANIUM BONE SILVER (Plate) ×3 IMPLANT
RASP L14 MM X W7 MM LARGE TPS STAINLESS (Blade) ×3
RASP L14 MM X W7 MM LARGE TPS STAINLESS STEEL SURGICAL TEAR CROSS CUT (Blade) ×3 IMPLANT
REAGENT PROBE SOLUTION ARCHITECT I2000 ARCHITECT 25ML 01L5640 (Procedure Accessories) ×3 IMPLANT
SCREW BN TI MATRIXMIDFACE 1.3MM 3MM (Screw) ×4 IMPLANT
SCREW BN TI MATRIXMIDFACE 1.3MM 4MM (Screw) ×16 IMPLANT
SCREW L4 MM OD1 MM TITANIUM MIDFACE SELF DRILL BONE MATRIXMIDFACE (Screw) ×12 IMPLANT
SCREW L4 MM OD1.3 MM TITANIUM SELF DRILL BONE MATRIXMIDFACE (Screw) ×3 IMPLANT
SHAMPOO BABY JOHNSONS HEAD-TO-TOE (Patient Supply) ×3
SHAMPOO BABY JOHNSONS HEAD-TO-TOE PEDIATRIC 1.7 OZ (Patient Supply) ×3 IMPLANT
SHAMPOO BB 1.7OZ JHNSN H2T PED (Patient Supply) ×4
SHEARS ELECTROSURGICAL L9 CM CURVE TAPER (Cautery) ×3
SHEARS ELECTROSURGICAL L9 CM CURVE TAPER 2 HANDCONTROL SOFT GRIP (Cautery) ×3 IMPLANT
SHEARS ESURG CRV TPR HRMN FOCUS+ 9CM (Cautery) ×4
SOLUTION BSS IRR STRL 15ML (Irrigation Solutions) ×4
SOLUTION IRRIGATION 0.9% BALANCED SALT (Irrigation Solutions) ×3
SOLUTION IRRIGATION 0.9% BALANCED SALT 15 ML (Irrigation Solutions) ×3 IMPLANT
SOLUTION OPHTHALMIC PREP HDPE BOTTLE (Solution) ×6
SOLUTION OPHTHALMIC PREP HDPE BOTTLE BETADINE 5% PVP IODINE 30 ML (Solution) ×6 IMPLANT
SOLUTION OPTH 5% PVP IOD 30ML BDINE STRL (Solution) ×8
SOLUTION PREP BETADINE 10% PVP IODINE 8OZ BOTTLE SKIN (Prep) ×3 IMPLANT
SOLUTION PROBE CONDITIONER (Procedure Accessories) ×4
SOLUTION PRP 10% PVP IOD 8OZ BDINE BTL (Prep) ×4
SOLUTION SRGSCRB 10% PVP IOD 4OZ PLS BTL (Scrub Supplies) ×4
SOLUTION SURGICAL SCRUB 10% PVP IODINE 4OZ PLASTIC BOTTLE AQUEOUS SKIN (Scrub Supplies) ×3 IMPLANT
SPONGE GAUZE L4 IN X W4 IN 12 PLY CURITY (Sponge) ×6 IMPLANT
SPONGE GAUZE L4 IN X W4 IN 16 PLY (Sponge) ×6
SPONGE GAUZE L4 IN X W4 IN 16 PLY MAXIMUM ABSORBENT TRAY CURITY (Sponge) ×6 IMPLANT
SPONGE GZE CTTN CRTY 4X4IN LF STRL 12 (Sponge) ×8
SPONGE GZE PLS CTTN CRTY 4X4IN LF STRL (Sponge) ×8
SPONGE LAP CTTN 18X18IN LF STRL 4 PLY (Sponge) ×4
SPONGE LAPAROTOMY L18 IN X W18 IN 4 PLY (Sponge) ×3
SPONGE LAPAROTOMY L18 IN X W18 IN 4 PLY RADIOPAQUE PYRONEMA FREE HIGH (Sponge) ×3 IMPLANT
SPONGE PEANUT C5 HOLDER DISSECTOR XRAY (Sponge) ×6
SPONGE PEANUT C5 HOLDER DISSECTOR XRAY DETECTABLE OD3/8 IN DUKAL (Sponge) ×6 IMPLANT
SPONGE PEANUT RADPQE STRL3/8IN (Sponge) ×8
STAPLER SKIN W4.8 MM X H3.4 MM 35 WIDE (Staplers) ×6 IMPLANT
STAPLER SKN SS MF PRM .51MM 4.8X3.4MM LF (Staplers) ×8
STRIP SKIN CLOSURE L4 IN X W1/2 IN (Dressing) ×3
STRIP SKIN CLOSURE L4 IN X W1/2 IN REINFORCE STERI-STRIP POLYESTER (Dressing) ×3 IMPLANT
STRIP SKNCLS PLSTR STRSTRP 4X.5IN LF (Dressing) ×4
SUTURE ABS 4-0 P-3 MNCRL MTPS 18IN MFL (Suture) ×4
SUTURE ABS 4-0 RB1 VCL 27IN BRD COAT UD (Suture) ×12
SUTURE ABS 5-0 RB1 PDS2 27IN MFL VIOL (Suture) ×8
SUTURE ABS 5-0 RB1 VCL 27IN BRD COAT UD (Suture) ×16
SUTURE ABS CR 3-0 RB1 27IN MFL BRN (Suture)
SUTURE ABS CR 3-0 SH 27IN MFL BRN (Suture) ×16
SUTURE ABS CR 4-0 RB1 27IN MFL BRN (Suture) ×8
SUTURE ABS CR 5-0 P-3 MTPS 18IN MFL BRN (Suture) ×8
SUTURE ABS PLN 6-0 PC-1 MTPS 18IN MFL (Suture) IMPLANT
SUTURE CHROMIC GUT CHROMIC 3-0 RB-1 L27 (Suture)
SUTURE CHROMIC GUT CHROMIC 3-0 RB-1 L27 IN MONOFILAMENT BROWN (Suture) IMPLANT
SUTURE CHROMIC GUT CHROMIC 3-0 SH L27 IN (Suture) ×12
SUTURE CHROMIC GUT CHROMIC 3-0 SH L27 IN MONOFILAMENT BROWN ABSORBABLE (Suture) ×12 IMPLANT
SUTURE CHROMIC GUT CHROMIC 4-0 RB-1 L27 (Suture) ×6
SUTURE CHROMIC GUT CHROMIC 4-0 RB-1 L27 IN MONOFILAMENT BROWN (Suture) ×6 IMPLANT
SUTURE CHROMIC GUT CHROMIC 5-0 P-3 L18 (Suture) ×6 IMPLANT
SUTURE COATED VICRYL 4-0 RB-1 L27 IN (Suture) ×9
SUTURE COATED VICRYL 4-0 RB-1 L27 IN BRAID COATED UNDYED ABSORBABLE (Suture) ×9 IMPLANT
SUTURE COATED VICRYL 5-0 RB-1 L27 IN (Suture) ×12
SUTURE COATED VICRYL 5-0 RB-1 L27 IN BRAID COATED UNDYED ABSORBABLE (Suture) ×12 IMPLANT
SUTURE ETHILON BLACK 3-0 PS-1 L18 IN (Suture) ×3
SUTURE ETHILON BLACK 3-0 PS-1 L18 IN MONOFILAMENT NONABSORBABLE (Suture) ×3 IMPLANT
SUTURE ETHILON BLACK 4-0 PS-2 L19 IN (Suture) ×6
SUTURE ETHILON BLACK 4-0 PS-2 L19 IN MONOFILAMENT NONABSORBABLE (Suture) ×6 IMPLANT
SUTURE MONOCRYL 4-0 P-3 L18 IN (Suture) ×3
SUTURE MONOCRYL 4-0 P-3 L18 IN MONOFILAMENT UNDYED ABSORBABLE (Suture) ×3 IMPLANT
SUTURE NABSB 3-0 PS1 ETH MTPS 18IN MFL (Suture) ×4
SUTURE NABSB 3-0 SH PRLN 30IN MFL BLU (Suture)
SUTURE NABSB 4-0 PS2 ETH MTPS 19IN MFL (Suture) ×8
SUTURE NABSB 5-0 P-3 PRLN 18IN MFL BLU (Suture) ×8
SUTURE NABSB 6-0 C-1 PRLN 18IN 2 ARM MFL (Suture)
SUTURE NABSB SLK 2-0 PRMHND 18IN BRD TIE (Suture) ×4
SUTURE NABSB SLK 2-0 SH PRMHND 30IN BRD (Suture) ×8
SUTURE NABSB SLK 3-0 PRMHND 18IN BRD TIE (Suture) ×4
SUTURE NABSB SLK 3-0 RB1 PRMHND 30IN BRD (Suture) ×8
SUTURE PDS II 5-0 RB-1 L27 IN (Suture) ×6
SUTURE PDS II 5-0 RB-1 L27 IN MONOFILAMENT VIOLET ABSORBABLE (Suture) ×6 IMPLANT
SUTURE PROLENE BLUE 3-0 SH L30 IN (Suture)
SUTURE PROLENE BLUE 3-0 SH L30 IN MONOFILAMENT NONABSORBABLE (Suture) IMPLANT
SUTURE PROLENE BLUE 5-0 P-3 L18 IN (Suture) ×6
SUTURE PROLENE BLUE 5-0 P-3 L18 IN MONOFILAMENT NONABSORBABLE (Suture) ×6 IMPLANT
SUTURE PROLENE BLUE 6-0 C-1 L18 IN 2 ARM (Suture)
SUTURE PROLENE BLUE 6-0 C-1 L18 IN 2 ARM MONOFILAMENT NONABSORBABLE (Suture) IMPLANT
SUTURE SILK PERMA HAND BLACK 2-0 L18 IN (Suture) ×3
SUTURE SILK PERMA HAND BLACK 2-0 L18 IN BRAID TIES 12 STRAND PRECUT (Suture) ×3 IMPLANT
SUTURE SILK PERMA HAND BLACK 2-0 SH L30 (Suture) ×6
SUTURE SILK PERMA HAND BLACK 2-0 SH L30 IN BRAID NONABSORBABLE (Suture) ×6 IMPLANT
SUTURE SILK PERMA HAND BLACK 3-0 L18 IN (Suture) ×3
SUTURE SILK PERMA HAND BLACK 3-0 L18 IN BRAID TIES 12 STRAND PRECUT (Suture) ×3 IMPLANT
SUTURE SILK PERMA HAND BLACK 3-0 RB-1 (Suture) ×6
SUTURE SILK PERMA HAND BLACK 3-0 RB-1 L30 IN BRAID NONABSORBABLE (Suture) ×6 IMPLANT
SUTURE V-LOC 90 3-0 CV-23 1/2 CIRCLE L9 IN ABS UNDYED (Procedure Accessories) ×15 IMPLANT
SUTURE V-LOC 90 3-0 P-14 3/8 CIRCLE L23 IN ABS UNDYED (Suture) ×12 IMPLANT
SUTURE V-LOC BARBED 3-0 (Suture) ×16
SYRINGE 10 ML CONTROL CONCENTRIC TIP (Syringes, Needles) ×12
SYRINGE 10 ML CONTROL CONCENTRIC TIP PYROGEN FREE DEHP FREE LOK (Syringes, Needles) ×12 IMPLANT
SYRINGE IRR TVK 60ML LF STRL LID SFT BLB (Syringes, Needles) ×4
SYRINGE MED 10ML LL LF STRL CNTRL CONC (Syringes, Needles) ×16
SYRINGE MED 1ML PRCSGLD 25GA 5/8IN LF (Needles) IMPLANT
SYRINGE MEDLINE 60 ML LID SOFT BULB TIP (Syringes, Needles) ×3
SYRINGE MEDLINE 60 ML LID SOFT BULB TIP IRRIGATION TYVEK (Syringes, Needles) ×3 IMPLANT
TRAY SKIN SCRUB L8 IN 6 WING 6 SPONGE STICK 2 TIP APPLICATOR DRY VINYL (Prep) ×3 IMPLANT
TRAY SKIN SCRUB MEDLINE L8 IN VINYL (Prep) ×3
TRAY SKN SCRB VNYL CTTN 8IN LF STRL 6 (Prep) ×4
TRAY SRG NASAL ORAL HEAD NCK ~~LOC~~ (Pack) ×4
TRAY SURGICAL JELLY (Procedure Accessories) ×3 IMPLANT
TRAY SURGICAL NASAL ORAL HEAD NEACK (Pack) ×3 IMPLANT
TUBING SCT PVC ARG 3/16IN 10FT LF STRL (Tubing) ×4
TUBING SUCTION ID3/16 IN L10 FT (Tubing) ×3
TUBING SUCTION ID3/16 IN L10 FT NONCONDUCTIVE STRAIGHT MALE FEMALE (Tubing) ×3 IMPLANT
V-LOC 90 VIOLET UNDYED (Procedure Accessories) ×20
synthes 0.7 drill bit ×4 IMPLANT

## 2022-03-23 NOTE — Anesthesia Preprocedure Evaluation (Signed)
Anesthesia Evaluation    AIRWAY    Mallampati: II    TM distance: >3 FB  Neck ROM: full  Mouth Opening:full  Planned to use difficult airway equipment: No CARDIOVASCULAR    cardiovascular exam normal       DENTAL    no notable dental hx               PULMONARY    pulmonary exam normal     OTHER FINDINGS                                                  Anesthesia Plan    ASA 2     general                     intravenous induction   Detailed anesthesia plan: general endotracheal        Post op pain management: per surgeon    informed consent obtained    Plan discussed with CRNA.      pertinent labs reviewed             Signed by: Jovita Gamma, MD 03/23/22 7:06 AM

## 2022-03-23 NOTE — Op Note (Signed)
FULL OPERATIVE NOTE    Date Time: 03/23/22 1:27 PM  Patient Name: Tonya CraftUGALDE,Tonya Sherman (MRN: [id number])  Attending Physician: Jodi GeraldsKuperstock, Laquanda Bick E, MD      Date of Operation:   03/23/2022    Providers Performing:   Surgeon(s) and Role:     * Jodi GeraldsKuperstock, Shauna Bodkins E, MD - Primary     * Everett GraffMorrison, Danielle, MD - PGY 5 Resident assisting    Surgical First Assistant(s):   None    Operative Procedure:   1) Feminizing Forehead contouring with anterior table frontal sinus set back (CPT [id number], 21139)  2) Bilateral feminizing orbital rim reconstruction (CPT 21172)   3) Bone autograft to forehead (CPT 20900)  4) Bilateral feminizing forehead scalp excision (CPT 15824 - modifier 50)  5) Bilateral feminizing brow lift (CPT 67900 - modifier 50)  6) Bilateral feminizing scalp rearrangement, Primary and Secondary Defect 15x17cm (CPT 14301, 14302 x 6units)  7) Feminizing rhinoplasty (CPT [id number], 30410)  8) Feminizing Mandible contouring (CPT 21299, 21025, 21120)        Preoperative Diagnosis:   Pre-Op Diagnosis Codes:     * Gender dysphoria in adult [F64.0]    Postoperative Diagnosis:   Post-Op Diagnosis Codes:     * Gender dysphoria in adult [F64.0]    Anesthesia:   General    Findings:   1) coronal approach utilized for feminizing forehead contouring  2) Frontal sinus encountered during forehead cranioplasty  3) Endonasal approach to feminizing rhinoplasty    Indications:   [name],Tonya Sherman is Sherman 31 y.o.-year-old transgender female with gender dysphoria on estrogen hormone replacement therapy. She was seen for both consultation and pre-operative visit for Sherman surgical consultation. Planned surgery includes:    1) Feminizing Forehead contouring with or without anterior table frontal sinus set back (CPT [id number], 21139 vs 21137)  2) Bilateral feminizing orbital rim reconstruction (CPT 21172)   3) Bone autograft to forehead (CPT 20900)  4) Bilateral feminizing forehead scalp excision\ (CPT 15824 - modifier 50)  5) Bilateral feminizing brow lift  (CPT 67900 - modifier 50)  6) Bilateral feminizing scalp rearrangement (CPT 14301, 14302 )  7) Feminizing rhinoplasty (CPT [id number], 30410)  8) Feminizing Mandible contouring (CPT 21299, 21025, 21120)      Each of these adjustments will allow for Sherman more feminine appearance which would aid in her gender dysphoria. Procedures and expectations were reviewed again today with the patient. Postoperative instructions were discussed today and will be discussed again at her first postoperative visit.     I had Sherman lengthy discussion with the patient regarding the benefits, alternatives, and risks of the planned facial feminization surgery including but not limited to bleeding, scarring, infection, numbness, facial weakness including weakness of raising her brows or closure of her eye, time for complete healing, early versus late appearance, facial features and asymmetries not expected to improve, failure to correct the problem and the potential need for further revision surgery. Other risks including swelling, bruising, nasal congestion, nasal stuffiness, septal perforation, numbness of the scalp, face, lips and lateral jaw, abscess or fluid collection, hematoma formation, voice changes (temporary or permanent) hypertrophic scar formation, visible scar formation, hair loss, injury to the brain, eyes, lips, nose, mouth, teeth, gums. No guarantees were made or implied. Written consent was obtained.       Operative Notes:   The patient was greeted in the pre-operative area where the patient's history, physical and consent were reviewed. We discussed once again the anticipated incisions, expected healing and  wound care obligations. The patient was then wheeled back to the OR room and placed onto the OR bed in the supine position. Sherman final timeout was then completed confirming the patient's name, identity and planned procedure. The patient was then induced into general anesthesia and intubated with an endotracheal tube by the  anesthesia provider and taped to the midline lower lip in the mesentery technique. Next, the patient's arms and legs were then padded and the patient was then secured using straps and tape with the arms placed into the papoose position.    Following repositioning the patient into the beach chair position, the forehead aspect of the procedure began. The anticipated coronal incision was then marked in an irregular fashion behind the hairline and then extending in Sherman coronal fashion posterior to bilateral temporal tufts. Next, the temporal tuft hair was then retracted and secured using autoclave tape and the posterior hair was secured and retracted in Sherman circumferential fashion. Bilateral eyes were then protected with lacrilube and were taped into Sherman closed position. The patient's face and hair were then prepped using betadine making sure to protect the patient's eyes during the application. 30cc of 1% lidocaine with epinephrine was then injected into the marked incision, posterior scalp, forehead and orbital rim regions.     Sherman 10 blade was then used to make an incision along the irregular coronal marked incision in Sherman beveled fashion. The incision extended posteriorly to the anterior midline hair and crossed the most posterior component of the bilateral temporal recession region. The incision was completed down through the skin to the level of the cranium. This was completed on both the right and the left. Hemostasis was achieved using Sherman combination of monopolar and bipolar cautery making sure to minimize thermal injury to the associated hair follicles. Langenbeck periosteal elevators were then used to raise Sherman forehead flap in Sherman subpericranial plane. Sherman freer was used to release the medial aspect of the deep temporal fascia to allow additional flexibility of the forehead flap and also to protect the frontal branch of the facial nerve. The skin flap was taken down to the level of the orbital rim, over the frontal bar and  releasing the arcus marginalis ligament allowing for adequate exposure of bilateral superior orbital rims and extending laterally to the zygomatic-frontal suture. The glabellar region was released and extending onto the superior frontal bones. Bilateral V1 nerves were identified and preserved. Double prong skin hooks were then placed securing the retracted position of the forehead skin flap.     Next, Sherman marking pen was then used to outline the anticipated frontal sinus. Sherman 5mm fluted cutting burr and drill was then used to slowly remove and contour the mid-forehead starting in the glabellar region. The frontal sinus was encountered and blue-lined in this region in Sherman circumferential fashion. The frontal sinus was then continued to be blue-lined laterally to the left and the right. The bony prominence of the lateral forehead was contoured away using the drill both on the right and left for Sherman feminine aesthetic, making sure to leave the anterior table of the frontal sinus intact but with the surrounding bone thinned in anticipation of removing the anterior table of the frontal sinus and subsequent set-back. Each pass with the drill, bony autograft fragments were collected into Sherman small steel cup purposefully and not incidentally. This was completed in anticipation for use of the bone graft for securing and reshaping of the forehead region.      With  the anterior table of the frontal sinus blue-lined, Sherman 22mm unguarded osteotome was then used to carefully fracture into the frontal sinus making sure to leave the mucosal lining intact as much as possible. This was completely circumferentially, allowing for the anterior table of the frontal sinus to be removed and placed into saline. Examination of the frontal sinus revealed patent frontal sinus outflow tract, and no evidence of sinus disease. The anterior table only covered what appeared to be only Sherman left frontal sinus and that the right frontal bar region was solid bone.  There was Sherman small bony intersinus septum and evidence of anterior protruding posterior table of the frontal sinus.       Next, the 68mm cutting burr was then used to reduce the lateral forehead bony prominence into Sherman smooth contour from the orbital rim and glabellar region allowing for Sherman smooth and feminine aesthetic. The drill was then used to reduce the lateral orbital rim region including extending down to the level of the zygomatico-frontal suture line. All drilling was completed with copious amounts of irrigation to ensure healthy bone. Sherman 9mm coarse diamond burr was then placed onto the drill and then rough edges of the open frontal sinus were then smoothed. The bony inter-sinus septum was then drilled away allowing for set-back of the anterior table of the frontal sinus. The anterior table was then reduced circumferentially in order to have the bony plate fit in to the newly recessed position allowing for Sherman smooth contour to the forehead region. Next, Sherman periosteal elevator was used to retract the orbital septum first on the left followed by the right, and the 2mm diamond burr was used to reduce the prominence of bilateral orbital rims and reconstructing the rims for Sherman less acute angle leading to an opening of the orbital sockets. Replacement of the skin flap showed Sherman smooth forehead contour and open orbital rim aesthetic leading to Sherman much improved feminine appearance.      Next, the flap was then retracted inferiorly once again and the anterior frontal sinus bony plate was then recessed into the frontal sinus and plated with Sherman 12-hole low profile upper face plate and secured with five 65mm or 34mm self-drilling or self-tapping screws.  The bone pate originally harvested during the drilling/contouring was then applied around the exposed frontal sinus for Sherman smooth feminine contour using Sherman freer.      Next, the forehead flap was then lifted back into its native position and then subsequently retracted superiorly to  allow for adequate brow lift. With the brow pulled to approximately 1cm above the orbital rim with the apex at the lateral iris bilaterally the lifted brow lift position was marked on the frontal bone. Next, Sherman converging brow lift drill guide was then placed at the marked sites and using Sherman 77mm wire pass drill, the converging bony anchor point was drilled and created to hold the elevated brows into position. These bony anchor points were created both on the right and left with copious amounts of irrigation to maintain healthy bone during the drilling. Next 5-0 PDS on RB-1 suture were then passed through the anchor point on the right and left and then the brows were then lifted into the desired position and sutured to the anchor point in Sherman mattress like fashion.      Next, the posterior scalp was then retracted using double prong skin hooks. The posterior scalp was then released in Sherman subgaleal plane using monopolar cautery in order  to reduce tension on the coronal incision. Multiple galeotomies were then completed using Sherman 15 blade to allow for maximum mechanical creep to advance the posterior hair.       With the brow lift and release of the posterior scalp in Sherman subgaleal fashion, there was evidence of redundant scalp tissue. Approximately 0.5 of hair bearing scalp tissue was then excised starting in the midline and moving laterally to the level of the lateral orbital rim. This was completed in order to reduce the redundancy and therefore aid with improved leveling of the skin. 3-0 V-loc galeal/deep dermal sutures were used to secure the closure and 4-0 monocryl subcuticular running sutures were used at the level of the skin until the temporal hair region which was closed with 3-0 V-Loc galeal/deep dermal sutures and staples at the level of the skin. Cooled laps were then placed onto the forehead region to aid with swelling.    Next, 5cc 1% lidocaine with epinephrine was then injected into the nose including nasal  dorsum, nasal side walls, tip, septum, columella. Given the patient's request for dorsal reduction and slight tip rotation, an endonasal approach was utilized.  Sherman 15 blade was then used to incise the marked incision using multiple stab incisions along the marked transcartilaginous endonasal approach. Sherman converse scissors was then used to dissect the soft tissue envelope off of the underlying nasal structure and dorsum including both bony and cartilaginous dorsum. An aufrecht retractor was then placed over the dorsum exposing the bony and cartilaginous dorsum.  Sherman cottle elevator was then used to remove any remaining periosteum and perichondrium off of the dorsum and nasal bones. An aufrecht retractor was then placed with visualized of the dorsal hump which appeared both bony and cartilaginous. Dorsal rasps were then used to reduce the bony dorsum and nasal bones. The dorsum was then irrigated with saline making sure to remove all of the remaining debris from the rasp. The upper lateral cartilages were then separated from the midline septum with Sherman cottle elevator and 15 blade. The 15 blade was then used to reduce the dorsum for Sherman nice curved dorsum with supratip break leading to an improved feminine appearance. With reduction, there remained at least 15mm of dorsal and caudal septal struts for adequate support.  Sherman cephalic trim was then completed bilaterally to the lateral  Crura which was incised during the initial approach. The cephalic trim simultaneously allowed slight rotation and deprojection of the nose also providing for Sherman more feminine structure and appearance to the nose. Next, Sherman left sided hemitransfixion incision was made and Sherman converse scissor was used to dissect between the medial crura and also to dissect posteriorly along the anterior septum. Then Sherman tongue-in-groove maneuver was completed and the medial crura were sutured to the septum allowing for additional support and rotation of the nasal tip. This  was secured with 4-0 chromic in Sherman mattress like fashion.       Next, the transcartilaginous, and hemitransfixsion incisions were then closed with 4-0 chromic in an interrupted fashion followed by Sherman quilting stitch to reapproximate the submucopericondrial flaps into the midline. Given the slight dorsal reduction, there was no evidence of open roof defect. The nose was then cleaned with saline soaked gauze followed by placement of mastisol, 1/2 steristrips and Sherman thermoplast splint.    Finally, 20 cc of 1% lidocaine with epinephrine was then injected into the inferior mandible region extending from the angle to the para-symphysis. Next, army-navy retractors were then placed  into the right oral cavity retracting the cheek and exposing the right gingivo-buccal sulcus. Monopolar cautery on the cut function was then used to make Sherman gingivobuccal incision extending just posterior to the premolars and posteriorly to the posterior limit of the patient's molars. This was completed approximately 1 cm from the gingiva to leave an adequate cuff of soft tissue from closure. Next, Sherman langenbeck periosteal elevator was then used to expose the mandible from the level of the angle to the parasymphysis. Once exposed on the right, Sherman saline soaked gauze was placed into the incision and the same incision was completed on the left for symmetrical approaches. Using Sherman reciprocating rasp, the lateral jaw bone was substantially reduced from posterior to anterior. Sherman minimum of 5-48mm of bone was removed along the angle and this was contoured anteriorly to the level of the para symphysis and lateral chin region. This was then once again repeated on the left for nice symmetrical results. Bilateral V3 nerves were identified and preserved. The rasp was then used to reduce the chin prominence including reduction of projection, height and lateral width. The chin reduction occurred only through the lateral incisions to avoid incising the mentalis muscle  which was discussed and agreed upon pre-operatively by the patient. These reductions allowed for an improved v-line shaped mandible contour for Sherman softer more feminine contour.   The wounds were then irrigated with saline and once hemostasis was achieved with monopolar cautery, the intra-oral incisions were then closed in Sherman running locking fashion with 3-0 chromic. This marked the end of the procedure.      Next, the hair was washed using Sherman combination of hydrogen peroxide and shampoo, dried, conditioned and then combed. The external incisions were covered with bacitracin and telfa gauze for the forehead. 4in kerlex gauze was then used to wrap the head circumferentially in both Sherman vertical and horizontal fashion followed by 4in Ace wraps. All needle, sponge and instrument counts were correct at the end of the case. The patient was then turned back to anesthesia and extubated without complication. The patient was then transferred to Sherman PACU bed and taken to pacu for further recovery.                   Estimated Blood Loss:   150cc    Implants:     Implant Name Type Inv. Item Serial No. Model No. Manufacturer Lot No. LRB No. Used Action   3mm SELF DRILLING SCREW    [id number]   N/Sherman 1 Implanted   SELF DRILLING SCREW    [id number]   N/Sherman 4 Implanted   PLATE BN TI STD MIC MATRIXMIDFACE . - [id number] Plate PLATE BN TI STD MIC MATRIXMIDFACE .  [id number] DEPUY  N/Sherman 1 Implanted          Drains:   Drains: no      Specimens:   None      Complications:   None    Signed by: Jodi Geralds, MD  [facility]

## 2022-03-23 NOTE — Anesthesia Postprocedure Evaluation (Signed)
Anesthesia Post Evaluation    Patient: Tonya Sherman    Procedure(s):  FOREHEAD CONTOURING WITH BROW LIFT, WITHOUT SCALP ADVANCEMENT, FEMINIZING RHINOPLASTY,FEMINIZING MANDIBLE CONTOURING    Anesthesia type: general    Last Vitals:   Vitals Value Taken Time   BP 129/91 03/23/22 1500   Temp 36.5 C (97.7 F) 03/23/22 1500   Pulse 85 03/23/22 1520   Resp 14 03/23/22 1520   SpO2 98 % 03/23/22 1520                 Anesthesia Post Evaluation:     Patient Evaluated: PACU  Patient Participation: complete - patient participated  Level of Consciousness: awake and alert  Pain Score: 1  Pain Management: adequate  Multimodal analgesia pain management approach    Airway Patency: patent    Anesthetic complications: No      PONV Status: none    Cardiovascular status: acceptable  Respiratory status: acceptable  Hydration status: acceptable        Signed by: Jovita Gamma, MD, 03/23/2022 5:06 PM

## 2022-03-23 NOTE — Transfer of Care (Signed)
Anesthesia Transfer of Care Note    Patient: Tonya Sherman    Procedures performed: Procedure(s):  FOREHEAD CONTOURING WITH BROW LIFT, WITHOUT SCALP ADVANCEMENT, FEMINIZING RHINOPLASTY,FEMINIZING MANDIBLE CONTOURING    Anesthesia type: General ETT    Patient location:phase 1    Last vitals:   Vitals:    03/23/22 1344   BP: 131/74   Pulse: 100   Resp: 16   Temp: 36.6 C (97.9 F)   SpO2: 99%       Post pain: Patient not complaining of pain, continue current therapy      Mental Status:awake    Respiratory Function: tolerating face mask    Cardiovascular: stable    Nausea/Vomiting: patient not complaining of nausea or vomiting    Hydration Status: adequate    Post assessment: no apparent anesthetic complications and no reportable events    Signed by: Kathi Ludwig, CRNA  03/23/22 1:45 PM

## 2022-03-23 NOTE — Plan of Care (Signed)
Transferred to room from PACU via stretcher, A&O x 4, denied CP, SOB, N/V, ambulated to her bed from the stretcher with assisrance. Oriented to her surroundings and POC discussed with pt. Pt reported understanding. SL only at this time. Pt denied the urgency to void. Reported pain level at her forehead at 4/10. SCD on bilateral lower legs. Call light within reach. All lines and drains clear of pt's way. Safety precautions in place and purposeful rounding will be continued.     Problem: Safety  Goal: Patient will be free from injury during hospitalization  Outcome: Progressing  Goal: Patient will be free from infection during hospitalization  Outcome: Progressing     Problem: Pain  Goal: Pain at adequate level as identified by patient  Outcome: Progressing     Problem: Discharge Barriers  Goal: Patient will be discharged home or other facility with appropriate resources  Outcome: Progressing     Problem: Side Effects from Pain Analgesia  Goal: Patient will experience minimal side effects of analgesic therapy  Outcome: Progressing

## 2022-03-23 NOTE — Nursing Progress Note (Signed)
Patient is A&O x 4, vital signs are stable, and she is on room air. She is on a regular diet, but has not attempted to eat yet. She is tolerating oral fluids. She did not experience nausea, vomiting, or dizziness. She stated that she had moderate pain that was managed w/ dilaudid. She did not experience flatus or a BM this shift. She had no urine output- no urge to void yet.

## 2022-03-24 MED ORDER — AMOXICILLIN-POT CLAVULANATE 875-125 MG PO TABS
1.0000 | ORAL_TABLET | Freq: Two times a day (BID) | ORAL | 0 refills | Status: AC
Start: 2022-03-24 — End: 2022-03-31

## 2022-03-24 MED ORDER — OXYCODONE HCL 5 MG PO TABS
5.0000 mg | ORAL_TABLET | ORAL | 0 refills | Status: AC | PRN
Start: 2022-03-24 — End: 2022-03-31

## 2022-03-24 MED ORDER — MUPIROCIN 2 % EX OINT
TOPICAL_OINTMENT | Freq: Two times a day (BID) | CUTANEOUS | Status: AC
Start: 2022-03-24 — End: ?

## 2022-03-24 MED ORDER — ACETAMINOPHEN 325 MG PO TABS
650.0000 mg | ORAL_TABLET | ORAL | Status: AC | PRN
Start: 2022-03-24 — End: ?

## 2022-03-24 MED ORDER — BENZOCAINE-MENTHOL MT LOZG (WRAP)
1.0000 | LOZENGE | OROMUCOSAL | Status: DC | PRN
Start: 2022-03-24 — End: 2022-03-24
  Administered 2022-03-24: 1 via BUCCAL
  Filled 2022-03-24: qty 1

## 2022-03-24 MED ORDER — CHLORHEXIDINE GLUCONATE 0.12 % MT SOLN
15.0000 mL | Freq: Two times a day (BID) | OROMUCOSAL | Status: AC
Start: 2022-03-24 — End: ?

## 2022-03-24 MED ORDER — ONDANSETRON 4 MG PO TBDP
8.0000 mg | ORAL_TABLET | Freq: Three times a day (TID) | ORAL | Status: AC | PRN
Start: 2022-03-24 — End: ?

## 2022-03-24 NOTE — Discharge Summary (Signed)
DISCHARGE NOTE    Date Time: 03/24/22 8:10 AM  Patient Name: Tonya Sherman A  Attending Physician: Jodi Geralds, MD    Date of Admission:   03/23/2022    Date of Discharge:   03/24/22      Reason for Admission:   Gender dysphoria in adult [F64.0]    Problems:   Lists the present on admission hospital problems  Present on Admission:  **None**      Hospital Problems:  Principal Problem:    Gender dysphoria in adult      Problem Lists:  Patient Active Problem List   Diagnosis    Gender dysphoria in adult                Discharge Dx:   Gender dysphoria in adult [F64.0]    Consultations:   None    Procedures performed:   1) Feminizing Forehead contouring with anterior table frontal sinus set back (CPT [phone], 21139)  2) Bilateral feminizing orbital rim reconstruction (CPT 21172)   3) Bone autograft to forehead (CPT 20900)  4) Bilateral feminizing forehead scalp excision (CPT 15824 - modifier 50)  5) Bilateral feminizing brow lift (CPT 67900 - modifier 50)  6) Bilateral feminizing scalp rearrangement, Primary and Secondary Defect 15x17cm (CPT 14301, 14302 x 6units)  7) Feminizing rhinoplasty (CPT [id number], 30410)  8) Feminizing Mandible contouring (CPT 21299, 21025, 21120)       Hospital Course:   03/23/2022 - OR. Admitted for post-op wound care, nausea and observation.   03/24/22 - No acute events. Tolerating PO. Voiding on own. Exam showing incisions clean, dry, intact, no evidence of fluid collections or hematoma. Wound care reviewed with the patient including completion of wound care video.     Discharge Medications:     Current Discharge Medication List        START taking these medications    Details   acetaminophen (TYLENOL) 325 MG tablet Take 2 tablets (650 mg) by mouth every 4 (four) hours as needed for Pain      amoxicillin-clavulanate (AUGMENTIN) 875-125 MG per tablet Take 1 tablet by mouth every 12 (twelve) hours for 7 days  Qty: 14 tablet, Refills: 0      chlorhexidine (PERIDEX) 0.12 % solution Use as  directed 15 mLs in the mouth or throat 2 (two) times daily      mupirocin (BACTROBAN) 2 % ointment Apply topically 2 (two) times daily      ondansetron (ZOFRAN-ODT) 4 MG disintegrating tablet Take 2 tablets (8 mg) by mouth every 8 (eight) hours as needed for Nausea      oxyCODONE (ROXICODONE) 5 MG immediate release tablet Take 1 tablet (5 mg) by mouth every 4 (four) hours as needed for Pain  Refills: 0           CONTINUE these medications which have NOT CHANGED    Details   ESTROGENS CONJUGATED IJ Inject as directed sundays      PROGESTERONE IM Inject into the muscle Every 3 months next dose 03/13/22               Discharge Instructions:   Follow-up with Dr. Salina April in 1 week for post-op appointment.       Signed by: Jodi Geralds, MD

## 2022-03-24 NOTE — Plan of Care (Signed)
Nursing Progress Note: VSS, no acute events this shift    Neuro: AOx4  Skin: intact, dressing covering head, nose, and jaw incisions - CDI   IV: 20LH, SL  Pain control: managed with prn IV dilaudid x1, prn oxy x1  GU: voiding freely  GI/Diet: regular diet, tolerating well, -n/v, active bowel sounds, +flatus, LBM 7/31 pta  CV: no tele  Resp: on RA     Mobility: SBA  Safety: fall mat in place, bed in lowest position, HOB 45, 2/4 side rails up, non-skid socks on, call bell and personal belongings within reach, safety measure enforced     POC: pain management, ambulate, D/C in AM    Problem: Moderate/High Fall Risk Score >5  Goal: Patient will remain free of falls  Outcome: Progressing  Flowsheets (Taken 03/23/2022 2000)  Moderate Risk (6-13):   MOD-Floor mat at bedside (where available) if appropriate   MOD-Remain with patient during toileting   MOD-Re-orient confused patients   MOD-Perform dangle, stand, walk (DSW) prior to mobilization     Problem: Safety  Goal: Patient will be free from injury during hospitalization  Outcome: Progressing  Flowsheets (Taken 03/24/2022 0128)  Patient will be free from injury during hospitalization:   Assess patient's risk for falls and implement fall prevention plan of care per policy   Provide and maintain safe environment   Hourly rounding  Goal: Patient will be free from infection during hospitalization  Outcome: Progressing  Flowsheets (Taken 03/24/2022 0128)  Free from Infection during hospitalization:   Assess and monitor for signs and symptoms of infection   Monitor all insertion sites (i.e. indwelling lines, tubes, urinary catheters, and drains)   Monitor lab/diagnostic results     Problem: Pain  Goal: Pain at adequate level as identified by patient  Outcome: Progressing  Flowsheets (Taken 03/24/2022 0128)  Pain at adequate level as identified by patient:   Assess for risk of opioid induced respiratory depression, including snoring/sleep apnea. Alert healthcare team of risk factors  identified.   Identify patient comfort function goal   Assess pain on admission, during daily assessment and/or before any "as needed" intervention(s)     Problem: Side Effects from Pain Analgesia  Goal: Patient will experience minimal side effects of analgesic therapy  Outcome: Progressing  Flowsheets (Taken 03/24/2022 0128)  Patient will experience minimal side effects of analgesic therapy:   Monitor/assess patient's respiratory status (RR depth, effort, breath sounds)   Assess for changes in cognitive function   Prevent/manage side effects per LIP orders (i.e. nausea, vomiting, pruritus, constipation, urinary retention, etc.)     Problem: Psychosocial and Spiritual Needs  Goal: Demonstrates ability to cope with hospitalization/illness  Outcome: Progressing  Flowsheets (Taken 03/24/2022 0128)  Demonstrates ability to cope with hospitalizations/illness:   Encourage verbalization of feelings/concerns/expectations   Provide quiet environment   Encourage patient to set small goals for self

## 2022-03-24 NOTE — Progress Notes (Signed)
D: Order for pt to discharge home.    A: Pt discharged with verbal and written instructions on post-op care, when to call the doctor, and medication schedule. Patient and pt's partner verbalized understanding of discharge teaching. Pt expressed no further questions or concerns. AVS was signed and copy was put into Pt's chart.    R: Pt was discharged via wheelchair to private vehicle with personal belognings.

## 2022-04-02 ENCOUNTER — Encounter: Payer: Self-pay | Admitting: Otolaryngology
